# Patient Record
Sex: Female | Born: 1945 | Race: White | Hispanic: No | Marital: Married | Smoking: Never smoker
Health system: Southern US, Community
[De-identification: ages and names within clinical notes are randomized; demographics above are authoritative.]

## PROBLEM LIST (undated history)

## (undated) DIAGNOSIS — I10 Essential (primary) hypertension: Secondary | ICD-10-CM

## (undated) DIAGNOSIS — F419 Anxiety disorder, unspecified: Secondary | ICD-10-CM

## (undated) DIAGNOSIS — E7800 Pure hypercholesterolemia, unspecified: Secondary | ICD-10-CM

## (undated) DIAGNOSIS — R51 Headache: Secondary | ICD-10-CM

## (undated) DIAGNOSIS — R519 Headache, unspecified: Secondary | ICD-10-CM

## (undated) DIAGNOSIS — R1310 Dysphagia, unspecified: Secondary | ICD-10-CM

## (undated) DIAGNOSIS — K219 Gastro-esophageal reflux disease without esophagitis: Secondary | ICD-10-CM

## (undated) DIAGNOSIS — F32A Depression, unspecified: Secondary | ICD-10-CM

## (undated) DIAGNOSIS — F329 Major depressive disorder, single episode, unspecified: Secondary | ICD-10-CM

## (undated) HISTORY — PX: CHOLECYSTECTOMY: SHX55

## (undated) HISTORY — PX: BACK SURGERY: SHX140

## (undated) HISTORY — DX: Dysphagia, unspecified: R13.10

## (undated) HISTORY — PX: ABDOMINAL HYSTERECTOMY: SHX81

## (undated) HISTORY — DX: Essential (primary) hypertension: I10

---

## 2011-12-31 HISTORY — PX: NASAL SEPTUM SURGERY: SHX37

## 2011-12-31 HISTORY — PX: BLADDER SUSPENSION: SHX72

## 2014-04-06 ENCOUNTER — Other Ambulatory Visit (INDEPENDENT_AMBULATORY_CARE_PROVIDER_SITE_OTHER): Payer: Self-pay | Admitting: *Deleted

## 2014-04-06 DIAGNOSIS — K838 Other specified diseases of biliary tract: Secondary | ICD-10-CM

## 2014-04-06 DIAGNOSIS — R509 Fever, unspecified: Secondary | ICD-10-CM

## 2014-04-06 DIAGNOSIS — R109 Unspecified abdominal pain: Secondary | ICD-10-CM

## 2014-04-06 DIAGNOSIS — K8309 Other cholangitis: Secondary | ICD-10-CM

## 2014-04-07 ENCOUNTER — Encounter (HOSPITAL_COMMUNITY): Payer: Self-pay | Admitting: Pharmacy Technician

## 2014-04-07 ENCOUNTER — Other Ambulatory Visit: Payer: Self-pay

## 2014-04-07 ENCOUNTER — Encounter (HOSPITAL_COMMUNITY)
Admission: RE | Admit: 2014-04-07 | Discharge: 2014-04-07 | Disposition: A | Discharge status: Home / Self Care | Payer: Medicare Other | Source: Ambulatory Visit | Attending: Internal Medicine | Admitting: Internal Medicine

## 2014-04-07 ENCOUNTER — Encounter (HOSPITAL_COMMUNITY): Payer: Self-pay

## 2014-04-07 VITALS — BP 133/59 | HR 63 | Temp 97.1°F | Resp 16 | Ht 61.0 in | Wt 133.0 lb

## 2014-04-07 DIAGNOSIS — K8309 Other cholangitis: Secondary | ICD-10-CM

## 2014-04-07 DIAGNOSIS — R109 Unspecified abdominal pain: Secondary | ICD-10-CM

## 2014-04-07 DIAGNOSIS — R509 Fever, unspecified: Secondary | ICD-10-CM

## 2014-04-07 DIAGNOSIS — K838 Other specified diseases of biliary tract: Secondary | ICD-10-CM

## 2014-04-07 HISTORY — DX: Major depressive disorder, single episode, unspecified: F32.9

## 2014-04-07 HISTORY — DX: Pure hypercholesterolemia, unspecified: E78.00

## 2014-04-07 HISTORY — DX: Depression, unspecified: F32.A

## 2014-04-07 LAB — COMPREHENSIVE METABOLIC PANEL
ALT: 11 U/L (ref 0–35)
AST: 15 U/L (ref 0–37)
Albumin: 4.1 g/dL (ref 3.5–5.2)
Alkaline Phosphatase: 93 U/L (ref 39–117)
BUN: 24 mg/dL — ABNORMAL HIGH (ref 6–23)
CO2: 29 mEq/L (ref 19–32)
Calcium: 9.4 mg/dL (ref 8.4–10.5)
Chloride: 103 mEq/L (ref 96–112)
Creatinine, Ser: 0.88 mg/dL (ref 0.50–1.10)
GFR calc Af Amer: 76 mL/min — ABNORMAL LOW (ref >90)
GFR calc non Af Amer: 66 mL/min — ABNORMAL LOW (ref >90)
Glucose, Bld: 82 mg/dL (ref 70–99)
Potassium: 5 mEq/L (ref 3.7–5.3)
Sodium: 141 mEq/L (ref 137–147)
Total Bilirubin: 0.3 mg/dL (ref 0.3–1.2)
Total Protein: 6.5 g/dL (ref 6.0–8.3)

## 2014-04-07 LAB — PROTIME-INR
INR: 0.92 (ref 0.00–1.49)
Prothrombin Time: 12.2 seconds (ref 11.6–15.2)

## 2014-04-07 LAB — HEMOGLOBIN AND HEMATOCRIT, BLOOD
HCT: 37.4 % (ref 36.0–46.0)
Hemoglobin: 12.4 g/dL (ref 12.0–15.0)

## 2014-04-07 NOTE — Patient Instructions (Signed)
Quetzaly Ebner  04/07/2014   Your procedure is scheduled on:  Friday, 04/08/14  Report to [facility] at [facility].  Call this number if you have problems the morning of surgery: [phone]   Remember:   Do not eat food or drink liquids after midnight.   Take these medicines the morning of surgery with A SIP OF WATER: xanax and protonix   Do not wear jewelry, make-up or nail polish.  Do not wear lotions, powders, or perfumes. You may wear deodorant.  Do not shave 48 hours prior to surgery. Men may shave face and neck.  Do not bring valuables to the hospital.  [facility] is not responsible                  for any belongings or valuables.               Contacts, dentures or bridgework may not be worn into surgery.  Leave suitcase in the car. After surgery it may be brought to your room.  For patients admitted to the hospital, discharge time is determined by your                treatment team.               Patients discharged the day of surgery will not be allowed to drive  home.  Name and phone number of your driver: family  Special Instructions: Shower using CHG 2 nights before surgery and the night before surgery.  If you shower the day of surgery use CHG.  Use special wash - you have one bottle of CHG for all showers.  You should use approximately 1/3 of the bottle for each shower.   Please read over the following fact sheets that you were given: Pain Booklet, MRSA Information, Surgical Site Infection Prevention, Anesthesia Post-op Instructions and Care and Recovery After Surgery   Endoscopic Retrograde Cholangiopancreatography (ERCP), Care After Refer to this sheet in the next few weeks. These instructions provide you with information on caring for yourself after your procedure. Your health care provider may also give you more specific instructions. Your treatment has been planned according to current medical practices, but problems sometimes occur. Call your health care provider if you  have any problems or questions after your procedure.  WHAT TO EXPECT AFTER THE PROCEDURE  After your procedure, it is typical to feel:   Soreness in your throat.   Sick to your stomach (nauseous).   Bloated.  Dizzy.   Fatigued. HOME CARE INSTRUCTIONS  Have a friend or family member stay with you for the first 24 hours after your procedure.  Start taking your usual medicines and eating normally as soon as you feel well enough to do so or as directed by your health care provider. SEEK MEDICAL CARE IF:  You have abdominal pain.   You develop signs of infection, such as:   Chills.   Feeling unwell.  SEEK IMMEDIATE MEDICAL CARE IF:  You have difficulty swallowing.  You have worsening throat, chest, or abdominal pain.  You vomit.  You have bloody or very black stools.  You have a fever. Document Released: 10/06/2013 Document Reviewed: 06/21/2013 [facility] Patient Information 2014 [place], [place]. Endoscopic Retrograde Cholangiopancreatography (ERCP) Endoscopic retrograde cholangiopancreatography (ERCP) is a procedure used to diagnosis many diseases of the pancreas, bile ducts, liver, and gallbladder. During ERCP a thin, lighted tube (endoscope) is passed through the mouth and down the back of the throat into the  first part of the small intestine (duodenum). A small, plastic tube (cannula) is then passed through the endoscope and directed into the bile duct or pancreatic duct. Dye is then injected through the cannula and X-rays are taken to study the biliary and pancreatic passageways.  LET [facility] PROVIDER KNOW ABOUT:   Any allergies you have.   All medicines you are taking, including vitamins, herbs, eyedrops, creams, and over-the-counter medicines.   Previous problems you or members of your family have had with the use of anesthetics.   Any blood disorders you have.   Previous surgeries you have had.   Medical conditions you have. RISKS AND  COMPLICATIONS Generally, ERCP is a safe procedure. However, as with any procedure, complications can occur. A simple removal of gallstones has the lowest rate of complications. Higher rates of complication occur in people who have poorly functioning bile or pancreatic ducts. Possible complications include:   Pancreatitis.  Bleeding.  Accidental punctures in the bowel wall, pancreas, or gall bladder.  Gall bladder or bile duct infection. BEFORE THE PROCEDURE   Do not eat or drink anything, including water, for at least 8 hours before the procedure or as directed by your health care provider.   Ask your health care provider whether you should stop taking certain medicines prior to your procedure.   Arrange for someone to drive you home. You will not be allowed to drive for 12 24 hours after the procedure. PROCEDURE   You will be given medicine through a vein (intravenously) to make you relaxed and sleepy.   You might have a breathing tube placed to give you medicine that makes you sleep (general anesthetic).   Your throat may be sprayed with medicine that numbs the area and prevents gagging (local anesthetic), or you may gargle this medicine.   You will lie on your left side.   The endoscope will be inserted through your mouth and into the duodenum. The tube will not interfere with your breathing. Gagging is prevented by the anesthesia.   While X-rays are being taken, you may be positioned on your stomach.   A small sample of tissue (biopsy) may be removed for examination. AFTER THE PROCEDURE   You will rest in bed until you are fully conscious.   When you first wake up, your throat may feel slightly sore.   You will not be allowed to eat or drink until numbness subsides.   Once you are able to drink, urinate, and sit on the edge of the bed without feeling sick to your stomach (nauseous) or dizzy, you may be allowed to go home. Document Released: 09/10/2001 Document  Revised: 10/06/2013 Document Reviewed: 07/27/2013 [facility] Patient Information 2014 [place].

## 2014-04-08 ENCOUNTER — Encounter (HOSPITAL_COMMUNITY): Payer: Medicare Other | Admitting: Anesthesiology

## 2014-04-08 ENCOUNTER — Ambulatory Visit (HOSPITAL_COMMUNITY): Payer: Medicare Other

## 2014-04-08 ENCOUNTER — Ambulatory Visit (HOSPITAL_COMMUNITY): Payer: Medicare Other | Admitting: Anesthesiology

## 2014-04-08 ENCOUNTER — Ambulatory Visit (HOSPITAL_COMMUNITY)
Admission: RE | Admit: 2014-04-08 | Discharge: 2014-04-08 | Disposition: A | Discharge status: Home / Self Care | Payer: Medicare Other | Source: Ambulatory Visit | Attending: Internal Medicine | Admitting: Internal Medicine

## 2014-04-08 ENCOUNTER — Encounter (HOSPITAL_COMMUNITY)
Admission: RE | Disposition: A | Discharge status: Home / Self Care | Payer: Self-pay | Source: Ambulatory Visit | Attending: Internal Medicine

## 2014-04-08 ENCOUNTER — Encounter (HOSPITAL_COMMUNITY): Payer: Self-pay | Admitting: *Deleted

## 2014-04-08 DIAGNOSIS — R109 Unspecified abdominal pain: Secondary | ICD-10-CM

## 2014-04-08 DIAGNOSIS — K8020 Calculus of gallbladder without cholecystitis without obstruction: Secondary | ICD-10-CM | POA: Insufficient documentation

## 2014-04-08 DIAGNOSIS — R509 Fever, unspecified: Secondary | ICD-10-CM

## 2014-04-08 DIAGNOSIS — Z79899 Other long term (current) drug therapy: Secondary | ICD-10-CM | POA: Insufficient documentation

## 2014-04-08 DIAGNOSIS — F3289 Other specified depressive episodes: Secondary | ICD-10-CM | POA: Insufficient documentation

## 2014-04-08 DIAGNOSIS — K8309 Other cholangitis: Secondary | ICD-10-CM

## 2014-04-08 DIAGNOSIS — E7800 Pure hypercholesterolemia, unspecified: Secondary | ICD-10-CM

## 2014-04-08 DIAGNOSIS — K219 Gastro-esophageal reflux disease without esophagitis: Secondary | ICD-10-CM | POA: Insufficient documentation

## 2014-04-08 DIAGNOSIS — F329 Major depressive disorder, single episode, unspecified: Secondary | ICD-10-CM | POA: Insufficient documentation

## 2014-04-08 DIAGNOSIS — K838 Other specified diseases of biliary tract: Secondary | ICD-10-CM | POA: Insufficient documentation

## 2014-04-08 HISTORY — PX: BALLOON DILATION: SHX5330

## 2014-04-08 HISTORY — PX: ERCP: SHX5425

## 2014-04-08 HISTORY — PX: SPHINCTEROTOMY: SHX5544

## 2014-04-08 HISTORY — PX: PANCREATIC STENT PLACEMENT: SHX5539

## 2014-04-08 SURGERY — ERCP, WITH INTERVENTION IF INDICATED
Anesthesia: General

## 2014-04-08 MED ORDER — GLYCOPYRROLATE 0.2 MG/ML IJ SOLN
INTRAMUSCULAR | Status: AC
  Filled 2014-04-08: qty 2

## 2014-04-08 MED ORDER — INDOMETHACIN 50 MG RE SUPP
RECTAL | Status: AC
  Filled 2014-04-08: qty 1

## 2014-04-08 MED ORDER — LACTATED RINGERS IV SOLN
INTRAVENOUS | Status: DC
  Administered 2014-04-08: 07:00:00 via INTRAVENOUS

## 2014-04-08 MED ORDER — CEFAZOLIN SODIUM-DEXTROSE 2-3 GM-% IV SOLR
2.0000 g | INTRAVENOUS | Status: AC
  Administered 2014-04-08: 2 g via INTRAVENOUS
  Filled 2014-04-08: qty 50

## 2014-04-08 MED ORDER — FENTANYL CITRATE 0.05 MG/ML IJ SOLN
INTRAMUSCULAR | Status: AC
  Filled 2014-04-08: qty 2

## 2014-04-08 MED ORDER — GLUCAGON HCL (RDNA) 1 MG IJ SOLR
INTRAMUSCULAR | Status: AC
  Filled 2014-04-08: qty 1

## 2014-04-08 MED ORDER — PROPOFOL 10 MG/ML IV EMUL
INTRAVENOUS | Status: AC
  Filled 2014-04-08: qty 20

## 2014-04-08 MED ORDER — GLYCOPYRROLATE 0.2 MG/ML IJ SOLN
0.2000 mg | Freq: Once | INTRAMUSCULAR | Status: AC
  Administered 2014-04-08: 0.2 mg via INTRAVENOUS

## 2014-04-08 MED ORDER — PROPOFOL 10 MG/ML IV BOLUS
INTRAVENOUS | Status: DC | PRN
  Administered 2014-04-08: 130 mg via INTRAVENOUS
  Administered 2014-04-08: 20 mg via INTRAVENOUS

## 2014-04-08 MED ORDER — ONDANSETRON HCL 4 MG/2ML IJ SOLN
4.0000 mg | Freq: Once | INTRAMUSCULAR | Status: AC | PRN
  Administered 2014-04-08: 4 mg via INTRAVENOUS
  Filled 2014-04-08: qty 2

## 2014-04-08 MED ORDER — PROPOFOL 10 MG/ML IV EMUL
INTRAVENOUS | Status: AC
Start: 2014-04-08 — End: 2014-04-08
  Filled 2014-04-08: qty 20

## 2014-04-08 MED ORDER — MIDAZOLAM HCL 2 MG/2ML IJ SOLN
INTRAMUSCULAR | Status: AC
Start: 2014-04-08 — End: 2014-04-08
  Filled 2014-04-08: qty 2

## 2014-04-08 MED ORDER — INDOMETHACIN 50 MG RE SUPP
50.0000 mg | Freq: Once | RECTAL | Status: AC
  Administered 2014-04-08: 50 mg via RECTAL

## 2014-04-08 MED ORDER — NEOSTIGMINE METHYLSULFATE 1 MG/ML IJ SOLN
INTRAMUSCULAR | Status: AC
  Filled 2014-04-08: qty 1

## 2014-04-08 MED ORDER — IOHEXOL 350 MG/ML SOLN
INTRAVENOUS | Status: DC | PRN
  Administered 2014-04-08: 08:00:00

## 2014-04-08 MED ORDER — LIDOCAINE HCL (CARDIAC) 10 MG/ML IV SOLN
INTRAVENOUS | Status: DC | PRN
  Administered 2014-04-08: 10 mg via INTRAVENOUS

## 2014-04-08 MED ORDER — STERILE WATER FOR IRRIGATION IR SOLN
Status: DC | PRN
  Administered 2014-04-08: 08:00:00

## 2014-04-08 MED ORDER — GLYCOPYRROLATE 0.2 MG/ML IJ SOLN
INTRAMUSCULAR | Status: DC | PRN
  Administered 2014-04-08: 0.2 mg via INTRAVENOUS

## 2014-04-08 MED ORDER — ONDANSETRON HCL 4 MG/2ML IJ SOLN
4.0000 mg | Freq: Once | INTRAMUSCULAR | Status: AC
  Administered 2014-04-08: 4 mg via INTRAVENOUS

## 2014-04-08 MED ORDER — DEXAMETHASONE SODIUM PHOSPHATE 4 MG/ML IJ SOLN
INTRAMUSCULAR | Status: AC
  Filled 2014-04-08: qty 1

## 2014-04-08 MED ORDER — LACTATED RINGERS IV SOLN: INTRAVENOUS | Status: DC

## 2014-04-08 MED ORDER — ONDANSETRON HCL 4 MG/2ML IJ SOLN
INTRAMUSCULAR | Status: AC
  Filled 2014-04-08: qty 2

## 2014-04-08 MED ORDER — LIDOCAINE HCL (PF) 1 % IJ SOLN
INTRAMUSCULAR | Status: AC
Start: 2014-04-08 — End: 2014-04-08
  Filled 2014-04-08: qty 5

## 2014-04-08 MED ORDER — FENTANYL CITRATE 0.05 MG/ML IJ SOLN
INTRAMUSCULAR | Status: DC | PRN
  Administered 2014-04-08: 25 ug via INTRAVENOUS
  Administered 2014-04-08: 50 ug via INTRAVENOUS
  Administered 2014-04-08: 25 ug via INTRAVENOUS

## 2014-04-08 MED ORDER — MIDAZOLAM HCL 2 MG/2ML IJ SOLN
1.0000 mg | INTRAMUSCULAR | Status: DC | PRN
  Administered 2014-04-08: 2 mg via INTRAVENOUS

## 2014-04-08 MED ORDER — FENTANYL CITRATE 0.05 MG/ML IJ SOLN
25.0000 ug | INTRAMUSCULAR | Status: DC | PRN
  Administered 2014-04-08: 25 ug via INTRAVENOUS
  Administered 2014-04-08: 50 ug via INTRAVENOUS
  Administered 2014-04-08: 25 ug via INTRAVENOUS

## 2014-04-08 MED ORDER — GLUCAGON HCL (RDNA) 1 MG IJ SOLR
INTRAMUSCULAR | Status: DC | PRN
  Administered 2014-04-08 (×8): 0.25 mg via INTRAVENOUS

## 2014-04-08 MED ORDER — HYDROCODONE-ACETAMINOPHEN 5-300 MG PO TABS: 1.0000 | ORAL_TABLET | Freq: Four times a day (QID) | ORAL | Status: DC | PRN

## 2014-04-08 MED ORDER — NEOSTIGMINE METHYLSULFATE 1 MG/ML IJ SOLN
INTRAMUSCULAR | Status: DC | PRN
  Administered 2014-04-08: 1 mg via INTRAVENOUS

## 2014-04-08 MED ORDER — ROCURONIUM BROMIDE 100 MG/10ML IV SOLN
INTRAVENOUS | Status: DC | PRN
  Administered 2014-04-08: 5 mg via INTRAVENOUS
  Administered 2014-04-08: 25 mg via INTRAVENOUS

## 2014-04-08 MED ORDER — GLYCOPYRROLATE 0.2 MG/ML IJ SOLN
INTRAMUSCULAR | Status: AC
  Filled 2014-04-08: qty 1

## 2014-04-08 SURGICAL SUPPLY — 29 items
BAG HAMPER (MISCELLANEOUS) ×3 IMPLANT
BALLN RETRIEVAL 12X15 (BALLOONS) ×2 IMPLANT
BALLN RETRIEVAL 12X15MM (BALLOONS) ×1
BASKET TRAPEZOID 3X6 (MISCELLANEOUS) IMPLANT
DEVICE INFLATION ENCORE 26 (MISCELLANEOUS) IMPLANT
DEVICE LOCKING W-BIOPSY CAP (MISCELLANEOUS) ×3 IMPLANT
GUIDEWIRE HYDRA JAGWIRE .35 (WIRE) IMPLANT
GUIDEWIRE JAG HINI 025X260CM (WIRE) IMPLANT
KIT CLEAN ENDO COMPLIANCE (KITS) ×3 IMPLANT
KIT ROOM TURNOVER APOR (KITS) ×3 IMPLANT
LUBRICANT JELLY 4.5OZ STERILE (MISCELLANEOUS) ×3 IMPLANT
NEEDLE HYPO 18GX1.5 BLUNT FILL (NEEDLE) ×3 IMPLANT
PAD ARMBOARD 7.5X6 YLW CONV (MISCELLANEOUS) ×6 IMPLANT
PATHFINDER 450CM 0.18 (STENTS) IMPLANT
POSITIONER HEAD 8X9X4 ADT (SOFTGOODS) IMPLANT
SNARE ROTATE MED OVAL 20MM (MISCELLANEOUS) IMPLANT
SNARE SHORT THROW 13M SML OVAL (MISCELLANEOUS) ×3 IMPLANT
SPHINCTEROTOME AUTOTOME .25 (MISCELLANEOUS) IMPLANT
SPHINCTEROTOME HYDRATOME 44 (MISCELLANEOUS) ×9 IMPLANT
SPONGE GAUZE 4X4 12PLY (GAUZE/BANDAGES/DRESSINGS) ×3 IMPLANT
STENT PANCREATIC PIGTAIL 4FX5 (Stent) ×3 IMPLANT
STENT RX PRELOADED 10FRX7CM (Stent) ×3 IMPLANT
SYR 3ML LL SCALE MARK (SYRINGE) ×3 IMPLANT
SYR 50ML LL SCALE MARK (SYRINGE) ×6 IMPLANT
SYSTEM CONTINUOUS INJECTION (MISCELLANEOUS) ×3 IMPLANT
TUBING ENDO SMARTCAP PENTAX (MISCELLANEOUS) ×3 IMPLANT
WALLSTENT METAL COVERED 10X60 (STENTS) IMPLANT
WALLSTENT METAL COVERED 10X80 (STENTS) IMPLANT
WATER STERILE IRR 1000ML POUR (IV SOLUTION) ×6 IMPLANT

## 2014-04-08 NOTE — Anesthesia Procedure Notes (Addendum)
Procedure Name: Intubation Date/Time: 04/08/2014 7:46 AM Performed by: Franco NonesYATES, Braelynn Benning S Pre-anesthesia Checklist: Patient identified, Patient being monitored, Timeout performed, Emergency Drugs available and Suction available Patient Re-evaluated:Patient Re-evaluated prior to inductionOxygen Delivery Method: Circle System Utilized Preoxygenation: Pre-oxygenation with 100% oxygen Intubation Type: IV induction, Rapid sequence and Cricoid Pressure applied Ventilation: Mask ventilation without difficulty and Oral airway inserted - appropriate to patient size Laryngoscope Size: Hyacinth MeekerMiller and 2 Grade View: Grade I Tube type: Oral Tube size: 7.0 mm Number of attempts: 1 Airway Equipment and Method: stylet Placement Confirmation: ETT inserted through vocal cords under direct vision,  positive ETCO2 and breath sounds checked- equal and bilateral Secured at: 20 cm Tube secured with: Tape Dental Injury: Teeth and Oropharynx as per pre-operative assessment

## 2014-04-08 NOTE — Transfer of Care (Signed)
Immediate Anesthesia Transfer of Care Note  Patient: Julie [name]  Procedure(s) Performed: Procedure(s) (LRB): ENDOSCOPIC RETROGRADE CHOLANGIOPANCREATOGRAPHY (ERCP) (N/A) BILIARY SPHINCTEROTOMY (N/A) BALLOON DILATION (N/A) PANCREATIC STENT PLACEMENT (N/A)  Patient Location: PACU  Anesthesia Type: General  Level of Consciousness: awake  Airway & Oxygen Therapy: Patient Spontanous Breathing and non-rebreather face mask  Post-op Assessment: Report given to PACU RN, Post -op Vital signs reviewed and stable and Patient moving all extremities  Post vital signs: Reviewed and stable  Complications: No apparent anesthesia complications

## 2014-04-08 NOTE — Anesthesia Preprocedure Evaluation (Addendum)
Anesthesia Evaluation  Patient identified by MRN, date of birth, ID band Patient awake    Reviewed: Allergy & Precautions, H&P , NPO status , Patient's Chart, lab work & pertinent test results  History of Anesthesia Complications Negative for: history of anesthetic complications  Airway Mallampati: III TM Distance: >3 FB     Dental  (+) Teeth Intact   Pulmonary neg pulmonary ROS,  breath sounds clear to auscultation        Cardiovascular negative cardio ROS  Rhythm:Regular Rate:Normal     Neuro/Psych PSYCHIATRIC DISORDERS Depression    GI/Hepatic GERD-  Medicated and Controlled,  Endo/Other    Renal/GU      Musculoskeletal   Abdominal   Peds  Hematology   Anesthesia Other Findings   Reproductive/Obstetrics                          Anesthesia Physical Anesthesia Plan  ASA: II  Anesthesia Plan: General   Post-op Pain Management:    Induction: Intravenous, Cricoid pressure planned and Rapid sequence  Airway Management Planned: Oral ETT  Additional Equipment:   Intra-op Plan:   Post-operative Plan: Extubation in OR  Informed Consent: I have reviewed the patients History and Physical, chart, labs and discussed the procedure including the risks, benefits and alternatives for the proposed anesthesia with the patient or authorized representative who has indicated his/her understanding and acceptance.     Plan Discussed with:   Anesthesia Plan Comments:         Anesthesia Quick Evaluation

## 2014-04-08 NOTE — Discharge Instructions (Signed)
Resume usual medications but do not take Celebrex or aspirin for 3 days. Clear liquids today and then usual diet starting in a.m. No driving for 24 hours. Notify if you have 10 greater than 100 F or pain not relieved with hydrocodone/acetaminophen.    Gastrointestinal Endoscopy Care After Refer to this sheet in the next few weeks. These instructions provide you with information on caring for yourself after your procedure. Your caregiver may also give you more specific instructions. Your treatment has been planned according to current medical practices, but problems sometimes occur. Call your caregiver if you have any problems or questions after your procedure. HOME CARE INSTRUCTIONS  If you were given medicine to help you relax (sedative), do not drive, operate machinery, or sign important documents for 24 hours.  Avoid alcohol and hot or warm beverages for the first 24 hours after the procedure.  Only take over-the-counter or prescription medicines for pain, discomfort, or fever as directed by your caregiver. You may resume taking your normal medicines unless your caregiver tells you otherwise. Ask your caregiver when you may resume taking medicines that may cause bleeding, such as aspirin, clopidogrel, or warfarin.  You may return to your normal diet and activities on the day after your procedure, or as directed by your caregiver. Walking may help to reduce any bloated feeling in your abdomen.  Drink enough fluids to keep your urine clear or pale yellow.  You may gargle with salt water if you have a sore throat. SEEK IMMEDIATE MEDICAL CARE IF:  You have severe nausea or vomiting.  You have severe abdominal pain, abdominal cramps that last longer than 6 hours, or abdominal swelling (distention).  You have severe shoulder or back pain.  You have trouble swallowing.  You have shortness of breath, your breathing is shallow, or you are breathing faster than normal.  You have a fever  or a rapid heartbeat.  You vomit blood or material that looks like coffee grounds.  You have bloody, black, or tarry stools. MAKE SURE YOU:  Understand these instructions.  Will watch your condition.  Will get help right away if you are not doing well or get worse. Document Released: 07/30/2004 Document Revised: 06/16/2012 Document Reviewed: 03/17/2012 [facility] Patient Information 2014 [place], [place].

## 2014-04-08 NOTE — Anesthesia Postprocedure Evaluation (Signed)
Anesthesia Post Note  Patient: Julie [name]  Procedure(s) Performed: Procedure(s) (LRB): ENDOSCOPIC RETROGRADE CHOLANGIOPANCREATOGRAPHY (ERCP) (N/A) BILIARY SPHINCTEROTOMY (N/A) BALLOON DILATION (N/A) PANCREATIC STENT PLACEMENT (N/A)  Anesthesia type: General  Patient location: PACU  Post pain: Pain level controlled  Post assessment: Post-op Vital signs reviewed, Patient's Cardiovascular Status Stable, Respiratory Function Stable, Patent Airway, No signs of Nausea or vomiting and Pain level controlled  Last Vitals:  Filed Vitals:   04/08/14 1031  BP: 114/54  Pulse: 90  Temp: 36.5 C  Resp: 16    Post vital signs: Reviewed and stable  Level of consciousness: awake    Complications: No apparent anesthesia complications

## 2014-04-08 NOTE — Op Note (Addendum)
ERCP PROCEDURE REPORT  PATIENT:  Julie [name]  MR#:  [id number] Birthdate:  1946-06-28, 68 y.o., female Endoscopist:  Dr. Malissa HippoNajeeb U. [name], MD Referred By:  Dr. Bonnetta BarryNo ref. provider found Procedure Date: 04/08/2014  Procedure:   ERCP with biliary sphincterotomy and pancreatic stenting.    Indications:  Patient is 68 year old Caucasian female who presents with history of 4 episodes of fever and chills and on workup found to have cholelithiasis and dilated CBD. She is undergoing ERCP with therapeutic intervention prior to cholecystectomy.            Informed Consent:  The risks, benefits, limitations, alternatives, and mponderable have been reviewed with the patient. I specifically discussed a 1 in 10 chance of pancreatitis, reaction to medications, bleeding, perforation and the possibility of a failed ERCP. Potential for sphincterotomy and stent placement also reviewed. Questions have been answered. All parties agreeable.  Please see history & physical in medical record for more information.  Medications:  Gen. endotracheal anesthesia. *Please see anesthesia record for complete details  Description of procedure:  Procedure performed in the OR. The patient was placed under anesthesia, intubated, and turned into semipermanent position. Therapeutic Pentax video duodenoscope passed through the oropharynx without any difficulty into the esophagus, stomach, and across the pylorus and pull, and descending duodenum.    Findings:  Normal appearing ampulla of Vater with short intramural segment. Normal pancreatogram. Difficult cannulation of CBD. Therefore guidewire guidewire left in pancreatic duct to help with cannulation and to leave pancreatic stent for prophylaxis. Markedly dilated CBD and CHD with poor filling of distal segment. CHD measured about 15 mm in diameter. Multiple filling defects in gallbladder co with stones. Biliary sphincterotomy performed. Possible accessory right hepatic duct were  large cystic duct. Biliary stent not placed because hydrajag wire kept going into accessory duct or cystic duct.  Therapeutic/Diagnostic Maneuvers Performed:    4 French 5 cm long single pigtail pancreatic stent placed for prophylaxis.   Complications:  None  Impression:  Dilated CBD and CHD without obvious stones. Long cystic duct remnant versus accessory right hepatic duct. Cholelithiasis. Biliary sphincterotomy performed. Pancreatic stent placed for prophylaxis. Biliary stent not placed.  Recommendations:  Indomethacin suppository 50 mg per rectum in PACU. No aspirin or NSAIDs for 72 hours. Clear liquids today. Hydrocodone/acetaminophen 5//300 by mouth every 6 when necessary for pain. Surgical consultation at a later date for cholecystectomy. Patient will need KUB in two weeks unless stent passes spontaneously.  Malissa Hippoajeeb U [name] List  04/08/2014  10:29 AM  CC: Dr. Oneita HurtPcp Not In System & Dr. No ref. provider found   Addendum; Plain abdominal film reveals no residual contrast in bile duct.

## 2014-04-08 NOTE — H&P (Signed)
Julie [name] is an 68 y.o. female.   Chief Complaint: Patient is here for ERCP with sphincterotomy. HPI: Patient is 68 year old Caucasian female who is referred through courtesy of  Dr. West Carbo for therapeutic ERCP. Patient was evaluated by him last month following an episode of fever and chills and some shoulder pain. He was also complaining of dysphagia and has undergone esophageal dilation with resolution of her dysphagia. Further evaluation revealed cholelithiasis and dilated bile duct leading to an MRCP. Once again bile duct was noted to be dilated but no stones are identified. It tapered distally. He therefore recommended preop ERCP prior to cholecystectomy. Patient states she has had 4 episodes of fever and rigors in the last 8 months. She has had nausea and vomiting with these spells and no abdominal pain. She has had intermittent shoulder pain. She has lost a few pounds which she states is voluntary. She denies melena or rectal bleeding. Family history is significant for gallstones and a granddaughter who had surgery at each 75. Patient states she's undergone about 6 EGD for dilations for dysphagia. She had screening colonoscopy last year.   Past Medical History  Diagnosis Date  . Hypercholesteremia   . Depression     Past Surgical History  Procedure Laterality Date  . Abdominal hysterectomy    . Bladder suspension  2013    [facility]  . Nasal septum surgery  2013    [facility]    History reviewed. No pertinent family history. Social History:  reports that she has never smoked. She does not have any smokeless tobacco history on file. She reports that she does not drink alcohol or use illicit drugs.  Allergies: No Known Allergies  Medications Prior to Admission  Medication Sig Dispense Refill  . ALPRAZolam (XANAX) 0.5 MG tablet Take 0.5 mg by mouth daily.      Marland Kitchen atorvastatin (LIPITOR) 20 MG tablet Take 20 mg by mouth daily.      . celecoxib (CELEBREX) 200  MG capsule Take 200 mg by mouth daily.      Marland Kitchen estradiol (ESTRACE) 0.5 MG tablet Take 0.5 mg by mouth every other day.      . pantoprazole (PROTONIX) 40 MG tablet Take 40 mg by mouth daily.      . traZODone (DESYREL) 100 MG tablet Take 100 mg by mouth 2 (two) times daily.      . Vitamin D, Ergocalciferol, (DRISDOL) 50000 UNITS CAPS capsule Take 50,000 Units by mouth every 7 (seven) days.        Results for orders placed during the hospital encounter of 04/07/14 (from the past 48 hour(s))  COMPREHENSIVE METABOLIC PANEL     Status: Abnormal   Collection Time    04/07/14  3:15 PM      Result Value Ref Range   Sodium 141  137 - 147 mEq/L   Potassium 5.0  3.7 - 5.3 mEq/L   Chloride 103  96 - 112 mEq/L   CO2 29  19 - 32 mEq/L   Glucose, Bld 82  70 - 99 mg/dL   BUN 24 (*) 6 - 23 mg/dL   Creatinine, Ser 0.88  0.50 - 1.10 mg/dL   Calcium 9.4  8.4 - 10.5 mg/dL   Total Protein 6.5  6.0 - 8.3 g/dL   Albumin 4.1  3.5 - 5.2 g/dL   AST 15  0 - 37 U/L   ALT 11  0 - 35 U/L   Alkaline Phosphatase 93  39 -  117 U/L   Total Bilirubin 0.3  0.3 - 1.2 mg/dL   GFR calc non Af Amer 66 (*) >90 mL/min   GFR calc Af Amer 76 (*) >90 mL/min   Comment: (NOTE)     The eGFR has been calculated using the CKD EPI equation.     This calculation has not been validated in all clinical situations.     eGFR's persistently <90 mL/min signify possible Chronic Kidney     Disease.  PROTIME-INR     Status: None   Collection Time    04/07/14  3:15 PM      Result Value Ref Range   Prothrombin Time 12.2  11.6 - 15.2 seconds   INR 0.92  0.00 - 1.49  HEMOGLOBIN AND HEMATOCRIT, BLOOD     Status: None   Collection Time    04/07/14  3:15 PM      Result Value Ref Range   Hemoglobin 12.4  12.0 - 15.0 g/dL   HCT 37.4  36.0 - 46.0 %   No results found.  ROS  There were no vitals taken for this visit. Physical Exam  Constitutional: She appears well-developed and well-nourished.  HENT:  Mouth/Throat: Oropharynx is clear  and moist.  Eyes: Conjunctivae are normal. No scleral icterus.  Neck: No thyromegaly present.  Cardiovascular: Normal rate, regular rhythm and normal heart sounds.   No murmur heard. Respiratory: Effort normal and breath sounds normal.  GI: Soft. Tenderness: mild midepigastric tendreness.  Musculoskeletal: She exhibits no edema.  Lymphadenopathy:    She has no cervical adenopathy.  Neurological: She is alert.  Skin: Skin is warm and dry.     Assessment/Plan Patient is 68 year old Caucasian female with history of fever and rigors found to have cholelithiasis and dilated bile duct. History is suggestive of self-limiting bouts of cholangitis and she is possibly passing stones. Patient is undergoing ERCP with sphincterotomy prior to cholecystectomy. Procedure and risks reviewed with the patient and her husband and they're agreeable  Tadashi Burkel U Julie [name] 04/08/2014, 7:21 AM

## 2014-04-11 ENCOUNTER — Encounter (HOSPITAL_COMMUNITY): Payer: Self-pay | Admitting: Internal Medicine

## 2014-04-21 ENCOUNTER — Other Ambulatory Visit (INDEPENDENT_AMBULATORY_CARE_PROVIDER_SITE_OTHER): Payer: Self-pay | Admitting: Internal Medicine

## 2014-04-21 DIAGNOSIS — Z9889 Other specified postprocedural states: Secondary | ICD-10-CM

## 2014-04-22 ENCOUNTER — Ambulatory Visit (HOSPITAL_COMMUNITY)
Admission: RE | Admit: 2014-04-22 | Discharge: 2014-04-22 | Disposition: A | Discharge status: Home / Self Care | Payer: Medicare Other | Source: Ambulatory Visit | Attending: Internal Medicine | Admitting: Internal Medicine

## 2014-04-22 DIAGNOSIS — Z9889 Other specified postprocedural states: Secondary | ICD-10-CM

## 2014-04-22 DIAGNOSIS — Z09 Encounter for follow-up examination after completed treatment for conditions other than malignant neoplasm: Secondary | ICD-10-CM | POA: Insufficient documentation

## 2014-12-20 ENCOUNTER — Encounter (INDEPENDENT_AMBULATORY_CARE_PROVIDER_SITE_OTHER): Payer: Self-pay

## 2015-01-21 IMAGING — CR DG ABDOMEN 1V
1 series · 1 of 1 positions shown · non-contrast
Comparison: One view abdomen 04/08/2014 and ERCP from that date.

CLINICAL DATA: Biliary stent placement 2 weeks ago.

EXAM:
ABDOMEN - 1 VIEW

[view not recorded]
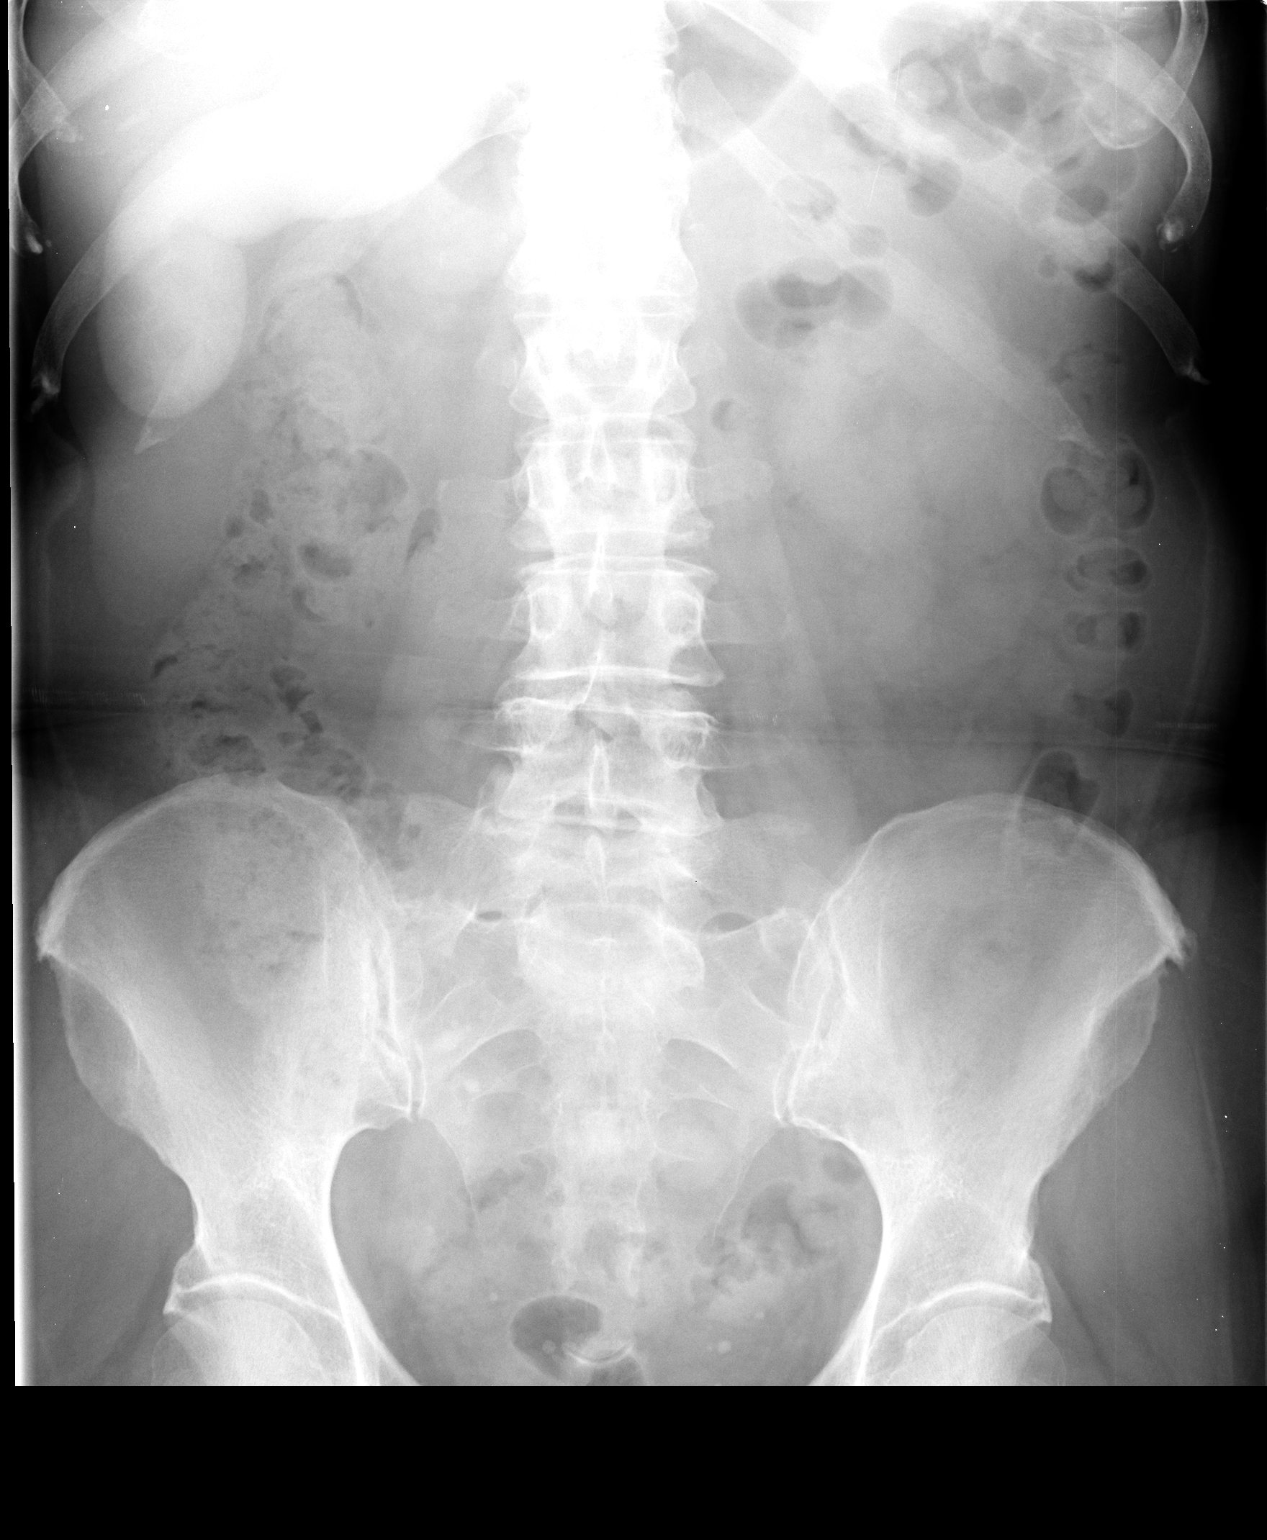

[1 of 1 positions shown; findings below may reference images not displayed]

FINDINGS: The previously demonstrated small caliber biliary stent is no longer
visualized. Portions of the upper abdomen are excluded from this
radiograph. There is contrast material within the gallbladder lumen.
Contrast outlines filling defects consistent with cholelithiasis.
The bowel gas pattern is normal. Pelvic phleboliths are grossly
stable.
IMPRESSION: Biliary stent is no longer visualized. There is persistent contrast
material and gallstones within the gallbladder lumen.

## 2016-11-19 ENCOUNTER — Other Ambulatory Visit: Payer: Self-pay | Admitting: Neurosurgery

## 2017-01-14 ENCOUNTER — Encounter (HOSPITAL_COMMUNITY)
Admission: RE | Admit: 2017-01-14 | Discharge: 2017-01-14 | Disposition: A | Discharge status: Home / Self Care | Payer: Medicare Other | Source: Ambulatory Visit | Attending: Neurosurgery | Admitting: Neurosurgery

## 2017-01-14 ENCOUNTER — Encounter (HOSPITAL_COMMUNITY): Payer: Self-pay

## 2017-01-14 DIAGNOSIS — Z01812 Encounter for preprocedural laboratory examination: Secondary | ICD-10-CM | POA: Insufficient documentation

## 2017-01-14 DIAGNOSIS — E7800 Pure hypercholesterolemia, unspecified: Secondary | ICD-10-CM | POA: Diagnosis not present

## 2017-01-14 HISTORY — DX: Headache, unspecified: R51.9

## 2017-01-14 HISTORY — DX: Headache: R51

## 2017-01-14 HISTORY — DX: Gastro-esophageal reflux disease without esophagitis: K21.9

## 2017-01-14 LAB — BASIC METABOLIC PANEL
Anion gap: 8 (ref 5–15)
BUN: 16 mg/dL (ref 6–20)
CO2: 25 mmol/L (ref 22–32)
Calcium: 9.3 mg/dL (ref 8.9–10.3)
Chloride: 106 mmol/L (ref 101–111)
Creatinine, Ser: 0.84 mg/dL (ref 0.44–1.00)
GFR calc Af Amer: >60 mL/min (ref >60)
GFR calc non Af Amer: >60 mL/min (ref >60)
Glucose, Bld: 80 mg/dL (ref 65–99)
Potassium: 4.1 mmol/L (ref 3.5–5.1)
Sodium: 139 mmol/L (ref 135–145)

## 2017-01-14 LAB — CBC
HCT: 42.2 % (ref 36.0–46.0)
Hemoglobin: 13.8 g/dL (ref 12.0–15.0)
MCH: 29.8 pg (ref 26.0–34.0)
MCHC: 32.7 g/dL (ref 30.0–36.0)
MCV: 91.1 fL (ref 78.0–100.0)
Platelets: 218 10*3/uL (ref 150–400)
RBC: 4.63 MIL/uL (ref 3.87–5.11)
RDW: 12.6 % (ref 11.5–15.5)
WBC: 6.2 10*3/uL (ref 4.0–10.5)

## 2017-01-14 LAB — TYPE AND SCREEN
ABO/RH(D): O POS
Antibody Screen: NEGATIVE

## 2017-01-14 LAB — SURGICAL PCR SCREEN
MRSA, PCR: NEGATIVE
Staphylococcus aureus: NEGATIVE

## 2017-01-14 LAB — ABO/RH: ABO/RH(D): O POS

## 2017-01-14 MED ORDER — CHLORHEXIDINE GLUCONATE CLOTH 2 % EX PADS: 6.0000 | MEDICATED_PAD | Freq: Once | CUTANEOUS | Status: DC

## 2017-01-14 NOTE — Pre-Procedure Instructions (Signed)
    [name]  01/14/2017      [id number] [place] [address] [place] [id number] Phone: [phone] Fax: [phone]  [facility] [address] [place], [place] [place] [id number] Phone: [phone] Fax: [phone]    Your procedure is scheduled on 01/21/17.  [facility]  Call this number if you have problems the morning of surgery:  [phone]   Remember:  Do not eat food or drink liquids after midnight.  Take these medicines the morning of surgery with A SIP OF WATER --xanax,estrace,protonix,ultram   Do not wear jewelry, make-up or nail polish.  Do not wear lotions, powders, or perfumes, or deoderant.  Do not shave 48 hours prior to surgery.  Men may shave face and neck.  Do not bring valuables to the hospital.  [facility] is not responsible for any belongings or valuables.  Contacts, dentures or bridgework may not be worn into surgery.  Leave your suitcase in the car.  After surgery it may be brought to your room.  For patients admitted to the hospital, discharge time will be determined by your treatment team.  Patients discharged the day of surgery will not be allowed to drive home.   Name and phone number of your driver:    Special instructions:  Do not take any aspirin,anti-inflammatories,vitamins,or herbal supplements 5-7 days prior to surgery.  Please read over the following fact sheets that you were given. MRSA Information

## 2017-01-17 NOTE — Progress Notes (Signed)
[place] attempting to [place]. Phone line busy at MD office.

## 2017-01-17 NOTE — Progress Notes (Signed)
Anesthesia Chart Review: Patient is a 71 year old female scheduled for right L3-4, L4-5 anterolateral lumbar interbody fusion on 01/21/17 by Dr. Venetia MaxonStern.   History includes never smoker, hypercholesterolemia, GERD, depression, headaches, nasal septum surgery, ERCP with sphincterotomy '15, cholecystectomy, hysterectomy.   PCP is Dr. Oscar Laaplan in [place], [place].  Meds include Xanax, Lipitor, Lexapro, estradiol, Flonase, Protonix, tramadol, trazodone.  BP (!) 150/64   Pulse (!) 50   Temp 36.7 C (Oral)   Resp 18   Ht 5\' 1"  (1.549 m)   Wt 141 lb 2 oz (64 kg)   SpO2 98%   BMI 26.67 kg/m   EKG 04/25/16 [facility]): SR, incomplete right BBB, LAD, voltage criteria for LVH, voltage criteria without S/ST abnormality, may be normal variant. EKG appears stable when compared with tracing from 04/07/14.   Preoperative labs noted. CBC and BMET WNL. T&S done.   If no acute changes then I anticipate that she can proceed as planned.  Velna Ochsllison Odeal Welden, PA-C [facility]esthesiology Phone [phone] 01/17/2017 3:23 PM

## 2017-01-20 ENCOUNTER — Encounter (HOSPITAL_COMMUNITY): Payer: Self-pay | Admitting: Anesthesiology

## 2017-01-20 NOTE — Anesthesia Preprocedure Evaluation (Addendum)
Anesthesia Evaluation  Patient identified by MRN, date of birth, ID band Patient awake    Reviewed: Allergy & Precautions, NPO status , Patient's Chart, lab work & pertinent test results  Airway Mallampati: III       Dental no notable dental hx. 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, L4-5   Abdominal   Peds  Hematology negative hematology ROS (+)   Anesthesia Other Findings   Reproductive/Obstetrics                            Anesthesia Physical Anesthesia Plan  ASA: II  Anesthesia Plan: General   Post-op Pain Management:    Induction: Intravenous  Airway Management Planned: Oral ETT  Additional Equipment:   Intra-op Plan:   Post-operative Plan: Extubation in OR  Informed Consent: I have reviewed the patients History and Physical, chart, labs and discussed the procedure including the risks, benefits and alternatives for the proposed anesthesia with the patient or authorized representative who has indicated his/her understanding and acceptance.     Plan Discussed with: CRNA, Anesthesiologist and Surgeon  Anesthesia Plan Comments:        Anesthesia Quick Evaluation

## 2017-01-21 ENCOUNTER — Inpatient Hospital Stay (HOSPITAL_COMMUNITY): Payer: Medicare Other | Admitting: Vascular Surgery

## 2017-01-21 ENCOUNTER — Inpatient Hospital Stay (HOSPITAL_COMMUNITY): Payer: Medicare Other

## 2017-01-21 ENCOUNTER — Encounter (HOSPITAL_COMMUNITY): Payer: Self-pay | Admitting: *Deleted

## 2017-01-21 ENCOUNTER — Encounter (HOSPITAL_COMMUNITY)
Admission: RE | Disposition: A | Discharge status: Home / Self Care | Payer: Self-pay | Source: Ambulatory Visit | Attending: Neurosurgery

## 2017-01-21 ENCOUNTER — Inpatient Hospital Stay (HOSPITAL_COMMUNITY)
Admission: RE | Admit: 2017-01-21 | Discharge: 2017-01-28 | DRG: 456 | Disposition: A | Discharge status: Home / Self Care | Payer: Medicare Other | Source: Ambulatory Visit | Attending: Neurosurgery | Admitting: Neurosurgery

## 2017-01-21 ENCOUNTER — Inpatient Hospital Stay (HOSPITAL_COMMUNITY): Payer: Medicare Other | Admitting: Anesthesiology

## 2017-01-21 DIAGNOSIS — K219 Gastro-esophageal reflux disease without esophagitis: Secondary | ICD-10-CM | POA: Diagnosis present

## 2017-01-21 DIAGNOSIS — M48061 Spinal stenosis, lumbar region without neurogenic claudication: Secondary | ICD-10-CM | POA: Diagnosis present

## 2017-01-21 DIAGNOSIS — J9690 Respiratory failure, unspecified, unspecified whether with hypoxia or hypercapnia: Secondary | ICD-10-CM | POA: Diagnosis not present

## 2017-01-21 DIAGNOSIS — N390 Urinary tract infection, site not specified: Secondary | ICD-10-CM | POA: Diagnosis not present

## 2017-01-21 DIAGNOSIS — M4316 Spondylolisthesis, lumbar region: Secondary | ICD-10-CM | POA: Diagnosis present

## 2017-01-21 DIAGNOSIS — R0600 Dyspnea, unspecified: Secondary | ICD-10-CM

## 2017-01-21 DIAGNOSIS — M419 Scoliosis, unspecified: Secondary | ICD-10-CM | POA: Diagnosis present

## 2017-01-21 DIAGNOSIS — E8770 Fluid overload, unspecified: Secondary | ICD-10-CM | POA: Diagnosis not present

## 2017-01-21 DIAGNOSIS — Z9049 Acquired absence of other specified parts of digestive tract: Secondary | ICD-10-CM

## 2017-01-21 DIAGNOSIS — J80 Acute respiratory distress syndrome: Secondary | ICD-10-CM | POA: Diagnosis not present

## 2017-01-21 DIAGNOSIS — F419 Anxiety disorder, unspecified: Secondary | ICD-10-CM | POA: Diagnosis present

## 2017-01-21 DIAGNOSIS — J9601 Acute respiratory failure with hypoxia: Secondary | ICD-10-CM | POA: Diagnosis not present

## 2017-01-21 DIAGNOSIS — Z79899 Other long term (current) drug therapy: Secondary | ICD-10-CM | POA: Diagnosis not present

## 2017-01-21 DIAGNOSIS — F329 Major depressive disorder, single episode, unspecified: Secondary | ICD-10-CM | POA: Diagnosis present

## 2017-01-21 DIAGNOSIS — J9811 Atelectasis: Secondary | ICD-10-CM | POA: Diagnosis not present

## 2017-01-21 DIAGNOSIS — E785 Hyperlipidemia, unspecified: Secondary | ICD-10-CM | POA: Diagnosis present

## 2017-01-21 DIAGNOSIS — R0989 Other specified symptoms and signs involving the circulatory and respiratory systems: Secondary | ICD-10-CM

## 2017-01-21 DIAGNOSIS — Z90710 Acquired absence of both cervix and uterus: Secondary | ICD-10-CM | POA: Diagnosis not present

## 2017-01-21 DIAGNOSIS — R262 Difficulty in walking, not elsewhere classified: Secondary | ICD-10-CM

## 2017-01-21 DIAGNOSIS — M5116 Intervertebral disc disorders with radiculopathy, lumbar region: Secondary | ICD-10-CM | POA: Diagnosis present

## 2017-01-21 DIAGNOSIS — I10 Essential (primary) hypertension: Secondary | ICD-10-CM | POA: Diagnosis present

## 2017-01-21 DIAGNOSIS — Z419 Encounter for procedure for purposes other than remedying health state, unspecified: Secondary | ICD-10-CM

## 2017-01-21 DIAGNOSIS — M4186 Other forms of scoliosis, lumbar region: Secondary | ICD-10-CM | POA: Diagnosis present

## 2017-01-21 DIAGNOSIS — J9602 Acute respiratory failure with hypercapnia: Secondary | ICD-10-CM | POA: Diagnosis not present

## 2017-01-21 DIAGNOSIS — M545 Low back pain: Secondary | ICD-10-CM | POA: Diagnosis present

## 2017-01-21 HISTORY — PX: LUMBAR PERCUTANEOUS PEDICLE SCREW 2 LEVEL: SHX5561

## 2017-01-21 HISTORY — PX: ANTERIOR LAT LUMBAR FUSION: SHX1168

## 2017-01-21 SURGERY — ANTERIOR LATERAL LUMBAR FUSION 2 LEVELS
Anesthesia: General | Laterality: Right

## 2017-01-21 MED ORDER — HEMOSTATIC AGENTS (NO CHARGE) OPTIME
TOPICAL | Status: DC | PRN
  Administered 2017-01-21: 1 via TOPICAL

## 2017-01-21 MED ORDER — ACETAMINOPHEN 325 MG PO TABS
650.0000 mg | ORAL_TABLET | ORAL | Status: DC | PRN
  Administered 2017-01-23 – 2017-01-24 (×3): 650 mg via ORAL
  Filled 2017-01-21 (×3): qty 2

## 2017-01-21 MED ORDER — CEFAZOLIN SODIUM-DEXTROSE 2-4 GM/100ML-% IV SOLN
2.0000 g | Freq: Three times a day (TID) | INTRAVENOUS | Status: AC
  Administered 2017-01-21 (×2): 2 g via INTRAVENOUS
  Filled 2017-01-21 (×2): qty 100

## 2017-01-21 MED ORDER — SUFENTANIL CITRATE 50 MCG/ML IV SOLN
INTRAVENOUS | Status: DC | PRN
  Administered 2017-01-21 (×2): 5 ug via INTRAVENOUS
  Administered 2017-01-21: 10 ug via INTRAVENOUS
  Administered 2017-01-21: 20 ug via INTRAVENOUS

## 2017-01-21 MED ORDER — BUPIVACAINE LIPOSOME 1.3 % IJ SUSP
20.0000 mL | INTRAMUSCULAR | Status: DC
  Filled 2017-01-21: qty 20

## 2017-01-21 MED ORDER — LIDOCAINE-EPINEPHRINE (PF) 2 %-1:200000 IJ SOLN
INTRAMUSCULAR | Status: AC
  Filled 2017-01-21: qty 20

## 2017-01-21 MED ORDER — MENTHOL 3 MG MT LOZG
1.0000 | LOZENGE | OROMUCOSAL | Status: DC | PRN
  Filled 2017-01-21: qty 9

## 2017-01-21 MED ORDER — MEPERIDINE HCL 25 MG/ML IJ SOLN
INTRAMUSCULAR | Status: AC
  Filled 2017-01-21: qty 1

## 2017-01-21 MED ORDER — EPHEDRINE 5 MG/ML INJ
INTRAVENOUS | Status: AC
  Filled 2017-01-21: qty 10

## 2017-01-21 MED ORDER — LIDOCAINE 2% (20 MG/ML) 5 ML SYRINGE
INTRAMUSCULAR | Status: AC
  Filled 2017-01-21: qty 5

## 2017-01-21 MED ORDER — ESTRADIOL 1 MG PO TABS
0.5000 mg | ORAL_TABLET | ORAL | Status: DC
  Administered 2017-01-23 – 2017-01-27 (×3): 0.5 mg via ORAL
  Filled 2017-01-21 (×3): qty 0.5

## 2017-01-21 MED ORDER — PROPOFOL 10 MG/ML IV BOLUS
INTRAVENOUS | Status: DC | PRN
  Administered 2017-01-21: 120 mg via INTRAVENOUS

## 2017-01-21 MED ORDER — THROMBIN 5000 UNITS EX SOLR
CUTANEOUS | Status: DC | PRN
  Administered 2017-01-21 (×2): 5000 [IU] via TOPICAL

## 2017-01-21 MED ORDER — MEPERIDINE HCL 25 MG/ML IJ SOLN
6.2500 mg | INTRAMUSCULAR | Status: DC | PRN
  Administered 2017-01-21: 12.5 mg via INTRAVENOUS
  Administered 2017-01-21 (×2): 6.25 mg via INTRAVENOUS

## 2017-01-21 MED ORDER — GLYCOPYRROLATE 0.2 MG/ML IJ SOLN
INTRAMUSCULAR | Status: DC | PRN
  Administered 2017-01-21 (×2): 0.2 mg via INTRAVENOUS

## 2017-01-21 MED ORDER — METHOCARBAMOL 500 MG PO TABS
500.0000 mg | ORAL_TABLET | Freq: Four times a day (QID) | ORAL | Status: DC | PRN
  Administered 2017-01-21 – 2017-01-27 (×6): 500 mg via ORAL
  Filled 2017-01-21 (×5): qty 1

## 2017-01-21 MED ORDER — FLUTICASONE PROPIONATE 50 MCG/ACT NA SUSP: 1.0000 | Freq: Every day | NASAL | Status: DC | PRN

## 2017-01-21 MED ORDER — THROMBIN 5000 UNITS EX SOLR
CUTANEOUS | Status: AC
  Filled 2017-01-21: qty 10000

## 2017-01-21 MED ORDER — DOCUSATE SODIUM 100 MG PO CAPS
100.0000 mg | ORAL_CAPSULE | Freq: Two times a day (BID) | ORAL | Status: DC
  Administered 2017-01-21 – 2017-01-27 (×12): 100 mg via ORAL
  Filled 2017-01-21 (×12): qty 1

## 2017-01-21 MED ORDER — MORPHINE SULFATE (PF) 4 MG/ML IV SOLN
1.0000 mg | INTRAVENOUS | Status: DC | PRN
  Administered 2017-01-24 – 2017-01-25 (×3): 2 mg via INTRAVENOUS
  Administered 2017-01-26: 4 mg via INTRAVENOUS
  Administered 2017-01-26 (×2): 2 mg via INTRAVENOUS
  Administered 2017-01-26 – 2017-01-27 (×3): 4 mg via INTRAVENOUS
  Filled 2017-01-21 (×9): qty 1

## 2017-01-21 MED ORDER — MORPHINE SULFATE (PF) 2 MG/ML IV SOLN
1.0000 mg | INTRAVENOUS | Status: DC | PRN
  Administered 2017-01-21: 4 mg via INTRAVENOUS

## 2017-01-21 MED ORDER — EPHEDRINE SULFATE 50 MG/ML IJ SOLN
INTRAMUSCULAR | Status: DC | PRN
  Administered 2017-01-21: 10 mg via INTRAVENOUS
  Administered 2017-01-21 (×2): 5 mg via INTRAVENOUS
  Administered 2017-01-21: 10 mg via INTRAVENOUS
  Administered 2017-01-21: 15 mg via INTRAVENOUS

## 2017-01-21 MED ORDER — MORPHINE SULFATE (PF) 4 MG/ML IV SOLN
INTRAVENOUS | Status: AC
  Filled 2017-01-21: qty 1

## 2017-01-21 MED ORDER — METOCLOPRAMIDE HCL 5 MG/ML IJ SOLN
INTRAMUSCULAR | Status: AC
  Filled 2017-01-21: qty 2

## 2017-01-21 MED ORDER — ONDANSETRON HCL 4 MG/2ML IJ SOLN
INTRAMUSCULAR | Status: DC | PRN
  Administered 2017-01-21: 4 mg via INTRAVENOUS

## 2017-01-21 MED ORDER — POLYETHYLENE GLYCOL 3350 17 G PO PACK: 17.0000 g | PACK | Freq: Every day | ORAL | Status: DC | PRN

## 2017-01-21 MED ORDER — DEXAMETHASONE SODIUM PHOSPHATE 10 MG/ML IJ SOLN
INTRAMUSCULAR | Status: AC
  Filled 2017-01-21: qty 1

## 2017-01-21 MED ORDER — KCL IN DEXTROSE-NACL 20-5-0.45 MEQ/L-%-% IV SOLN
INTRAVENOUS | Status: DC
  Administered 2017-01-21: 14:00:00 via INTRAVENOUS

## 2017-01-21 MED ORDER — ALPRAZOLAM 0.5 MG PO TABS
0.5000 mg | ORAL_TABLET | Freq: Two times a day (BID) | ORAL | Status: DC
  Administered 2017-01-21 – 2017-01-22 (×3): 0.5 mg via ORAL
  Filled 2017-01-21 (×3): qty 1

## 2017-01-21 MED ORDER — HYDROCODONE-ACETAMINOPHEN 5-325 MG PO TABS: 1.0000 | ORAL_TABLET | ORAL | Status: DC | PRN

## 2017-01-21 MED ORDER — ONDANSETRON HCL 4 MG/2ML IJ SOLN
INTRAMUSCULAR | Status: AC
  Filled 2017-01-21: qty 2

## 2017-01-21 MED ORDER — LIDOCAINE-EPINEPHRINE (PF) 2 %-1:200000 IJ SOLN
INTRAMUSCULAR | Status: DC | PRN
  Administered 2017-01-21: 5 mL via INTRADERMAL

## 2017-01-21 MED ORDER — TRAZODONE HCL 100 MG PO TABS
100.0000 mg | ORAL_TABLET | Freq: Two times a day (BID) | ORAL | Status: DC
  Administered 2017-01-21 – 2017-01-22 (×3): 100 mg via ORAL
  Filled 2017-01-21 (×5): qty 1

## 2017-01-21 MED ORDER — SUCCINYLCHOLINE CHLORIDE 200 MG/10ML IV SOSY
PREFILLED_SYRINGE | INTRAVENOUS | Status: DC | PRN
  Administered 2017-01-21: 100 mg via INTRAVENOUS

## 2017-01-21 MED ORDER — FLEET ENEMA 7-19 GM/118ML RE ENEM: 1.0000 | ENEMA | Freq: Once | RECTAL | Status: DC | PRN

## 2017-01-21 MED ORDER — ACETAMINOPHEN 650 MG RE SUPP: 650.0000 mg | RECTAL | Status: DC | PRN

## 2017-01-21 MED ORDER — CEFAZOLIN SODIUM-DEXTROSE 2-4 GM/100ML-% IV SOLN
2.0000 g | INTRAVENOUS | Status: AC
  Administered 2017-01-21: 2 g via INTRAVENOUS
  Filled 2017-01-21: qty 100

## 2017-01-21 MED ORDER — PANTOPRAZOLE SODIUM 40 MG PO TBEC
40.0000 mg | DELAYED_RELEASE_TABLET | Freq: Every day | ORAL | Status: DC
  Administered 2017-01-21 – 2017-01-27 (×7): 40 mg via ORAL
  Filled 2017-01-21 (×7): qty 1

## 2017-01-21 MED ORDER — ONDANSETRON HCL 4 MG/2ML IJ SOLN: 4.0000 mg | INTRAMUSCULAR | Status: DC | PRN

## 2017-01-21 MED ORDER — METOCLOPRAMIDE HCL 5 MG/ML IJ SOLN
INTRAMUSCULAR | Status: DC | PRN
  Administered 2017-01-21: 10 mg via INTRAVENOUS

## 2017-01-21 MED ORDER — PANTOPRAZOLE SODIUM 40 MG PO TBEC: 40.0000 mg | DELAYED_RELEASE_TABLET | Freq: Every day | ORAL | Status: DC

## 2017-01-21 MED ORDER — OXYCODONE-ACETAMINOPHEN 5-325 MG PO TABS
1.0000 | ORAL_TABLET | ORAL | Status: DC | PRN
  Administered 2017-01-21: 2 via ORAL
  Filled 2017-01-21: qty 2

## 2017-01-21 MED ORDER — ZOLPIDEM TARTRATE 5 MG PO TABS: 5.0000 mg | ORAL_TABLET | Freq: Every evening | ORAL | Status: DC | PRN

## 2017-01-21 MED ORDER — MIDAZOLAM HCL 2 MG/2ML IJ SOLN
INTRAMUSCULAR | Status: AC
  Filled 2017-01-21: qty 2

## 2017-01-21 MED ORDER — BUPIVACAINE HCL (PF) 0.5 % IJ SOLN
INTRAMUSCULAR | Status: DC | PRN
  Administered 2017-01-21: 5 mL

## 2017-01-21 MED ORDER — KCL IN DEXTROSE-NACL 20-5-0.45 MEQ/L-%-% IV SOLN
INTRAVENOUS | Status: AC
  Filled 2017-01-21: qty 1000

## 2017-01-21 MED ORDER — PROPOFOL 10 MG/ML IV BOLUS
INTRAVENOUS | Status: AC
  Filled 2017-01-21: qty 20

## 2017-01-21 MED ORDER — SODIUM CHLORIDE 0.9% FLUSH: 3.0000 mL | INTRAVENOUS | Status: DC | PRN

## 2017-01-21 MED ORDER — BUPIVACAINE LIPOSOME 1.3 % IJ SUSP
INTRAMUSCULAR | Status: DC | PRN
  Administered 2017-01-21: 20 mL

## 2017-01-21 MED ORDER — MIDAZOLAM HCL 5 MG/5ML IJ SOLN
INTRAMUSCULAR | Status: DC | PRN
  Administered 2017-01-21: 2 mg via INTRAVENOUS

## 2017-01-21 MED ORDER — ATORVASTATIN CALCIUM 20 MG PO TABS
20.0000 mg | ORAL_TABLET | Freq: Every day | ORAL | Status: DC
  Administered 2017-01-21 – 2017-01-27 (×7): 20 mg via ORAL
  Filled 2017-01-21 (×7): qty 1

## 2017-01-21 MED ORDER — TRAMADOL HCL 50 MG PO TABS
50.0000 mg | ORAL_TABLET | Freq: Every day | ORAL | Status: DC | PRN
  Administered 2017-01-23 – 2017-01-24 (×2): 50 mg via ORAL
  Filled 2017-01-21 (×2): qty 1

## 2017-01-21 MED ORDER — LACTATED RINGERS IV SOLN
INTRAVENOUS | Status: DC | PRN
  Administered 2017-01-21 (×3): via INTRAVENOUS

## 2017-01-21 MED ORDER — PHENYLEPHRINE 40 MCG/ML (10ML) SYRINGE FOR IV PUSH (FOR BLOOD PRESSURE SUPPORT)
PREFILLED_SYRINGE | INTRAVENOUS | Status: DC | PRN
  Administered 2017-01-21 (×4): 80 ug via INTRAVENOUS

## 2017-01-21 MED ORDER — SUFENTANIL CITRATE 50 MCG/ML IV SOLN
INTRAVENOUS | Status: AC
  Filled 2017-01-21: qty 1

## 2017-01-21 MED ORDER — PHENOL 1.4 % MT LIQD: 1.0000 | OROMUCOSAL | Status: DC | PRN

## 2017-01-21 MED ORDER — ESCITALOPRAM OXALATE 10 MG PO TABS
10.0000 mg | ORAL_TABLET | Freq: Every day | ORAL | Status: DC
  Administered 2017-01-22 – 2017-01-27 (×6): 10 mg via ORAL
  Filled 2017-01-21 (×6): qty 1

## 2017-01-21 MED ORDER — PANTOPRAZOLE SODIUM 40 MG IV SOLR: 40.0000 mg | Freq: Every day | INTRAVENOUS | Status: DC

## 2017-01-21 MED ORDER — SODIUM CHLORIDE 0.9% FLUSH
3.0000 mL | Freq: Two times a day (BID) | INTRAVENOUS | Status: DC
  Administered 2017-01-21 – 2017-01-27 (×9): 3 mL via INTRAVENOUS

## 2017-01-21 MED ORDER — 0.9 % SODIUM CHLORIDE (POUR BTL) OPTIME
TOPICAL | Status: DC | PRN
  Administered 2017-01-21: 1000 mL

## 2017-01-21 MED ORDER — LIDOCAINE 2% (20 MG/ML) 5 ML SYRINGE
INTRAMUSCULAR | Status: DC | PRN
  Administered 2017-01-21: 100 mg via INTRAVENOUS

## 2017-01-21 MED ORDER — HYDROCODONE-ACETAMINOPHEN 10-325 MG PO TABS
1.0000 | ORAL_TABLET | ORAL | Status: DC | PRN
  Administered 2017-01-21 – 2017-01-22 (×6): 2 via ORAL
  Administered 2017-01-23: 1 via ORAL
  Filled 2017-01-21 (×5): qty 2
  Filled 2017-01-21: qty 1
  Filled 2017-01-21: qty 2

## 2017-01-21 MED ORDER — METHOCARBAMOL 500 MG PO TABS
ORAL_TABLET | ORAL | Status: AC
  Filled 2017-01-21: qty 1

## 2017-01-21 MED ORDER — DEXAMETHASONE SODIUM PHOSPHATE 10 MG/ML IJ SOLN
INTRAMUSCULAR | Status: DC | PRN
  Administered 2017-01-21: 5 mg via INTRAVENOUS

## 2017-01-21 MED ORDER — METHOCARBAMOL 1000 MG/10ML IJ SOLN: 500.0000 mg | Freq: Four times a day (QID) | INTRAMUSCULAR | Status: DC | PRN

## 2017-01-21 MED ORDER — BISACODYL 10 MG RE SUPP
10.0000 mg | Freq: Every day | RECTAL | Status: DC | PRN
  Filled 2017-01-21: qty 1

## 2017-01-21 MED ORDER — SUCCINYLCHOLINE CHLORIDE 200 MG/10ML IV SOSY
PREFILLED_SYRINGE | INTRAVENOUS | Status: AC
  Filled 2017-01-21: qty 10

## 2017-01-21 MED ORDER — PHENYLEPHRINE 40 MCG/ML (10ML) SYRINGE FOR IV PUSH (FOR BLOOD PRESSURE SUPPORT)
PREFILLED_SYRINGE | INTRAVENOUS | Status: AC
  Filled 2017-01-21: qty 10

## 2017-01-21 MED ORDER — SODIUM CHLORIDE 0.9 % IJ SOLN
INTRAMUSCULAR | Status: AC
  Filled 2017-01-21: qty 10

## 2017-01-21 MED ORDER — METOCLOPRAMIDE HCL 5 MG/ML IJ SOLN: 10.0000 mg | Freq: Once | INTRAMUSCULAR | Status: DC | PRN

## 2017-01-21 SURGICAL SUPPLY — 80 items
BLADE CLIPPER SURG (BLADE) IMPLANT
CAGE MODULUS XL 10X18X55 - 10 (Cage) ×3 IMPLANT
CARTRIDGE OIL MAESTRO DRILL (MISCELLANEOUS) IMPLANT
CLIP NEUROVISION LG (CLIP) ×3 IMPLANT
CONT SPEC 4OZ CLIKSEAL STRL BL (MISCELLANEOUS) ×3 IMPLANT
COVER BACK TABLE 24X17X13 BIG (DRAPES) IMPLANT
COVER BACK TABLE 60X90IN (DRAPES) IMPLANT
DECANTER SPIKE VIAL GLASS SM (MISCELLANEOUS) ×6 IMPLANT
DERMABOND ADVANCED (GAUZE/BANDAGES/DRESSINGS) ×8
DERMABOND ADVANCED .7 DNX12 (GAUZE/BANDAGES/DRESSINGS) ×4 IMPLANT
DIFFUSER DRILL AIR PNEUMATIC (MISCELLANEOUS) IMPLANT
DRAPE C-ARM 42X72 X-RAY (DRAPES) ×6 IMPLANT
DRAPE C-ARMOR (DRAPES) ×12 IMPLANT
DRAPE LAPAROTOMY 100X72X124 (DRAPES) ×6 IMPLANT
DRAPE POUCH INSTRU U-SHP 10X18 (DRAPES) ×6 IMPLANT
DRAPE SURG 17X23 STRL (DRAPES) ×3 IMPLANT
DRSG OPSITE POSTOP 3X4 (GAUZE/BANDAGES/DRESSINGS) ×6 IMPLANT
DRSG OPSITE POSTOP 4X6 (GAUZE/BANDAGES/DRESSINGS) ×9 IMPLANT
DURAPREP 26ML APPLICATOR (WOUND CARE) ×6 IMPLANT
ELECT REM PT RETURN 9FT ADLT (ELECTROSURGICAL) ×6
ELECTRODE REM PT RTRN 9FT ADLT (ELECTROSURGICAL) ×2 IMPLANT
GAUZE SPONGE 4X4 12PLY STRL (GAUZE/BANDAGES/DRESSINGS) ×3 IMPLANT
GAUZE SPONGE 4X4 16PLY XRAY LF (GAUZE/BANDAGES/DRESSINGS) ×3 IMPLANT
GLOVE BIO SURGEON STRL SZ8 (GLOVE) ×6 IMPLANT
GLOVE BIOGEL PI IND STRL 7.0 (GLOVE) ×1 IMPLANT
GLOVE BIOGEL PI IND STRL 8 (GLOVE) ×2 IMPLANT
GLOVE BIOGEL PI IND STRL 8.5 (GLOVE) ×2 IMPLANT
GLOVE BIOGEL PI INDICATOR 7.0 (GLOVE) ×2
GLOVE BIOGEL PI INDICATOR 8 (GLOVE) ×4
GLOVE BIOGEL PI INDICATOR 8.5 (GLOVE) ×4
GLOVE ECLIPSE 7.5 STRL STRAW (GLOVE) ×6 IMPLANT
GLOVE ECLIPSE 8.0 STRL XLNG CF (GLOVE) IMPLANT
GLOVE EXAM NITRILE LRG STRL (GLOVE) IMPLANT
GLOVE EXAM NITRILE XL STR (GLOVE) IMPLANT
GLOVE EXAM NITRILE XS STR PU (GLOVE) IMPLANT
GOWN STRL REUS W/ TWL LRG LVL3 (GOWN DISPOSABLE) IMPLANT
GOWN STRL REUS W/ TWL XL LVL3 (GOWN DISPOSABLE) ×1 IMPLANT
GOWN STRL REUS W/TWL 2XL LVL3 (GOWN DISPOSABLE) IMPLANT
GOWN STRL REUS W/TWL LRG LVL3 (GOWN DISPOSABLE)
GOWN STRL REUS W/TWL XL LVL3 (GOWN DISPOSABLE) ×2
GUIDEWIRE NITINOL BEVEL TIP (WIRE) ×18 IMPLANT
KIT BASIN OR (CUSTOM PROCEDURE TRAY) ×3 IMPLANT
KIT DILATOR XLIF 5 (KITS) ×1 IMPLANT
KIT INFUSE X SMALL 1.4CC (Orthopedic Implant) ×3 IMPLANT
KIT POSITION SURG JACKSON T1 (MISCELLANEOUS) ×3 IMPLANT
KIT ROOM TURNOVER OR (KITS) ×3 IMPLANT
KIT SURGICAL ACCESS MAXCESS 4 (KITS) ×3 IMPLANT
KIT XLIF (KITS) ×2
MARKER SKIN DUAL TIP RULER LAB (MISCELLANEOUS) ×6 IMPLANT
MODULE NVM5 NEXT GEN EMG (NEEDLE) ×3 IMPLANT
MODULUS XLW 10X22X50MM 15DEG (Spine Construct) ×2 IMPLANT
NEEDLE DIAMOND SPRINGLESS III (NEEDLE) ×3 IMPLANT
NEEDLE HYPO 25X1 1.5 SAFETY (NEEDLE) ×3 IMPLANT
NS IRRIG 1000ML POUR BTL (IV SOLUTION) ×3 IMPLANT
OIL CARTRIDGE MAESTRO DRILL (MISCELLANEOUS)
PACK LAMINECTOMY NEURO (CUSTOM PROCEDURE TRAY) ×6 IMPLANT
PAD ARMBOARD 7.5X6 YLW CONV (MISCELLANEOUS) ×9 IMPLANT
PATTIES SURGICAL .5 X.5 (GAUZE/BANDAGES/DRESSINGS) IMPLANT
PATTIES SURGICAL .5 X1 (DISPOSABLE) IMPLANT
PATTIES SURGICAL 1X1 (DISPOSABLE) IMPLANT
PUTTY BONE ATTRAX 10CC STRIP (Putty) ×3 IMPLANT
ROD RELINE TI MAS LD 5.5X65 (Rod) ×6 IMPLANT
SCREW LOCK RELINE 5.5 TULIP (Screw) ×18 IMPLANT
SCREW MAS RELINE POLY 6.5X40 (Screw) ×12 IMPLANT
SCREW SHANK MAS MOD 6.5X40MM (Screw) ×6 IMPLANT
SPINE TULIP RELINE MOD (Neuro Prosthesis/Implant) ×6 IMPLANT
SPONGE LAP 4X18 X RAY DECT (DISPOSABLE) IMPLANT
SPONGE SURGIFOAM ABS GEL SZ50 (HEMOSTASIS) ×3 IMPLANT
STAPLER SKIN PROX WIDE 3.9 (STAPLE) ×6 IMPLANT
SUT VIC AB 1 CT1 18XBRD ANBCTR (SUTURE) ×2 IMPLANT
SUT VIC AB 1 CT1 8-18 (SUTURE) ×4
SUT VIC AB 2-0 CT1 18 (SUTURE) ×9 IMPLANT
SUT VIC AB 2-0 CT2 18 VCP726D (SUTURE) ×3 IMPLANT
SUT VIC AB 3-0 SH 8-18 (SUTURE) ×12 IMPLANT
SYR INSULIN 1ML 31GX6 SAFETY (SYRINGE) IMPLANT
TOWEL OR 17X24 6PK STRL BLUE (TOWEL DISPOSABLE) ×3 IMPLANT
TOWEL OR 17X26 10 PK STRL BLUE (TOWEL DISPOSABLE) ×6 IMPLANT
TRAY FOLEY W/METER SILVER 16FR (SET/KITS/TRAYS/PACK) ×3 IMPLANT
TULIP SPINE RELINE MOD (Neuro Prosthesis/Implant) ×2 IMPLANT
WATER STERILE IRR 1000ML POUR (IV SOLUTION) ×3 IMPLANT

## 2017-01-21 NOTE — Progress Notes (Signed)
Orthopedic Tech Progress Note Patient Details:  Julie [name] 16-Jul-1946 [id number] Patient already has brace. Patient ID: Julie [name], female   DOB: 16-Jul-1946, 71 y.o.   MRN: [id number]   Julie [name], Julie [name] 01/21/2017, 4:00 PM

## 2017-01-21 NOTE — H&P (Signed)
Patient ID:   [id number] Patient: Julie [name]  Date of Birth: 1946/03/14 Visit Type: Office Visit   Date: 11/18/2016 02:00 PM Provider: Danae Orleans. Venetia Maxon MD   This 71 year old female presents for back pain.  History of Present Illness: 1.  back pain    The patient comes in to review the results of her bone density scan.   11/15/16 bone density scan reveals a t-score of -1.8. This patient is considered to have low bone mass according to World Health Organization [facility]) criteria. Bone density is between 10 and 25% below young normal. Treatment is advised.       PAST MEDICAL HISTORY, SURGICAL HISTORY, FAMILY HISTORY, SOCIAL HISTORY AND REVIEW OF SYSTEMS  10/28/2016, which I have signed.   MEDICATIONS(added, continued or stopped this visit): Started Medication Directions Instruction Stopped   atorvastatin 20 mg tablet take 1 tablet by oral route  every day     Divigel 0.5 mg/0.5 gram (0.1 %) transdermal gel packet apply 1 packet by topical route  every day to upper thigh     escitalopram 10 mg tablet take 1 tablet by oral route  every day     Mobic 15 mg tablet take 1 tablet by oral route  every day     pantoprazole 40 mg tablet,delayed release take 1 tablet by oral route  every day    10/28/2016 tramadol 50 mg tablet take 1 tablet by oral route  every 6 hours as needed     trazodone 100 mg tablet take 1 tablet by oral route  every bedtime     Xanax 0.5 mg tablet take 1 tablet by oral route 3 times every day       ALLERGIES: Ingredient Reaction Medication Name Comment  NO KNOWN ALLERGIES     No known allergies.    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 Therefore, she is an acceptable candidate for surgery and we will proceed with the L3-4, L4-5 XLIF like we had discussed on her previous visit.   Schedule  L3-4, L4-5 XLIF. Nurse education given. Fitted for LSO brace.              Provider:  Danae Orleans. Venetia Maxon MD  11/18/2016 02:40 PM Dictation edited by: Gabriel Rainwater    Patient ID:   [id number] Patient: Julie [name]  Date of Birth: June 23, 1946 Visit Type: Office Visit   Date: 10/28/2016 01:00 PM Provider: Danae Orleans. Venetia Maxon MD   This 71 year old female presents for back pain.  History of Present Illness: 1.  back pain    Julie [name], 71 year old retired female visits for evaluation of lumbar and bilateral leg pain left>right.  She recalls pain onset 2015 without recollection of injury.  Physical therapy and epidural injections have offered no relief. She reports her leg pain is worse than her back pain. Her left leg pain radiates into her big toe and right leg pain only goes to her thigh and shin. Her pain is much worse when walking and can only walk a couple blocks or 15 minutes before her pain starts. She reports her pain gets 50% better when she is sitting. Pain is 8/10 at its worst in bilateral legs and 6/10 in her back.   PT 2015 offered no  relief ESI's 20173 offered no relief  Mobic 15 mg one per day  History: Reflux, cholesterol, depression Surgical history: bladder sling, deviated septum repair, cholecystectomy all greater than 10 years ago  X-rays on Canopy reveal mild levoconvex scoliosis, L4-5, L5-S1 degenerative changes and arthritis, L4-L5 retrolisthesis of 4.7 mm in neutral, fusion of L5 and S1 vertebrae  07/15/16 MRI: Areas of thecal sac stenosis at L3-4 and L4-5, mild. Areas of neural foraminal narrowing at L4-5 and L5-S1 with possible exiting nerve root compromise.        PAST MEDICAL/SURGICAL HISTORY   (Detailed)  Disease/disorder Onset Date Management Date Comments    Hysterectomy, vaginal  [facility] 10/25/2016 -    Cholecystectomy    Anxiety      Arthritis    [facility] 10/25/2016 -  Depression      GERD         PAST MEDICAL HISTORY, SURGICAL HISTORY,  FAMILY HISTORY, SOCIAL HISTORY AND REVIEW OF SYSTEMS  10/28/2016, which I have signed.  Family History  (Detailed)   SOCIAL HISTORY  (Detailed) Tobacco use reviewed. Preferred language is Unknown.   Smoking status: Never smoker.  SMOKING STATUS Use Status Type Smoking Status Usage Per Day Years Used Total Pack Years  no/never  Never smoker             MEDICATIONS(added, continued or stopped this visit): Started Medication Directions Instruction Stopped   atorvastatin 20 mg tablet take 1 tablet by oral route  every day     Divigel 0.5 mg/0.5 gram (0.1 %) transdermal gel packet apply 1 packet by topical route  every day to upper thigh     escitalopram 10 mg tablet take 1 tablet by oral route  every day     Mobic 15 mg tablet take 1 tablet by oral route  every day     pantoprazole 40 mg tablet,delayed release take 1 tablet by oral route  every day    10/28/2016 tramadol 50 mg tablet take 1 tablet by oral route  every 6 hours as needed     trazodone 100 mg tablet take 1 tablet by oral route  every bedtime     Xanax 0.5 mg tablet take 1 tablet by oral route 3 times every day       ALLERGIES: Ingredient Reaction Medication Name Comment  NO KNOWN ALLERGIES     No known allergies.    Vitals Date Temp F BP Pulse Ht In Wt Lb BMI BSA Pain Score  10/28/2016  173/83 58 61 147 27.78  5/10     PHYSICAL EXAM General Level of Distress: no acute distress Overall Appearance: normal    Cardiovascular Cardiac: regular rate and rhythm without murmur  Respiratory Lungs: clear to auscultation  Neurological Recent and Remote Memory: normal Attention Span and Concentration:   normal Language: normal Fund of Knowledge: normal  Right Left Sensation: normal normal Upper Extremity Coordination: normal normal  Lower Extremity Coordination: normal normal  Musculoskeletal Gait and Station: normal  Right Left Upper Extremity Muscle Strength: normal normal Lower Extremity  Muscle Strength: normal normal Upper Extremity Muscle Tone:  normal normal Lower Extremity Muscle Tone: normal normal  Motor Strength Upper and lower extremity motor strength was tested in the clinically pertinent muscles.     Deep Tendon Reflexes  Right Left Biceps: normal normal Triceps: normal normal Brachiloradialis: normal normal Patellar: normal normal Achilles: normal normal  Sensory Sensation was tested at L1 to S1.   Cranial Nerves II. Optic Nerve/Visual Fields:  normal III. Oculomotor: normal IV. Trochlear: normal V. Trigeminal: normal VI. Abducens: normal VII. Facial: normal VIII. Acoustic/Vestibular: normal IX. Glossopharyngeal: normal X. Vagus: normal XI. Spinal Accessory: normal XII. Hypoglossal: normal  Motor and other Tests Lhermittes: negative Rhomberg: negative    Right Left Hoffman's: normal normal Clonus: normal normal Babinski: normal normal SLR: positive positive Patrick's Pearlean Brownie): negative negative Toe Walk: normal normal Toe Lift: normal normal Heel Walk: normal normal SI Joint: nontender nontender   Additional Findings:  No pain to palpation on low back or sciatic notch discomfort, toe touch to the floor, UE and LE strength is normal, symmetric reflexes   DIAGNOSTIC RESULTS X-rays on Canopy reveal mild levoconvex scoliosis, L3-4, L4-5 degenerative changes and arthritis, L4-L5 retrolisthesis of 4.7 mm in neutral, fusion of L5 and S1 vertebrae  07/15/16 MRI: Areas of thecal sac stenosis at L3-4 and L4-5, mild. Areas of neural foraminal narrowing at L4-5 and L5-S1 with possible exiting nerve root compromise.    IMPRESSION The patient is experiencing low back and left>right leg pain. On review of her imaging, she has a mild levoconvex scoliosis, degeneration of L3-4 and L4-5, and neural foraminal narrowing at L3-4 on the right and L4-5 on the left that is causing nerve root compression. Her L5 and S1 vertebrae have also fused, but this  finding is stable. On confrontational testing, her strength and reflexes are normal, but she does have a positive SLR bilaterally and significant pain to the lateral aspect of her left knee on Patricks testing. I would recommend an L3-4, L4-5 XLIF for alleviation of her low back and bilateral leg symptoms, but I would like for her to get a bone density scan first to see if her bones are strong enough for surgery. We also got scoliosis and bilateral hip X-rays today for further workup of her levoconvex scoliosis and abnormal Patricks test on the left. I will prescribe her with stronger medication for additional pain relief.   Completed Orders (this encounter) Order Details Reason Side Interpretation Result Initial Treatment Date Region  Lumbar Spine- AP/Lat/Obls/Spot/Flex/Ex      10/28/2016 All Levels to All Levels  Lifestyle education follow up with primary physcian for elevated blood pressure.        Giving encouragement to exercise Encourage patient to eat a well balanced diet and follow up with primary physcian.        Scoliosis- AP/Lat 1 of 2 AND 3V BILAT HIPS AND 7V L/S     10/28/2016 All Levels to All Levels  Hip - Bilateral Lateral W/AP Pelvis 2 of 2 and 2V SCOLIOSIS and 7V L/S     10/28/2016 All Levels to All Levels   Assessment/Plan # Detail Type Description   1. Assessment Lumbar radiculopathy (M54.16).   Plan Orders Bone Density.       2. Assessment Essential (primary) hypertension (I10).       3. Assessment Body mass index (BMI) 27.0-27.9, adult (Z61.09).   Plan Orders Today's instructions / counseling include(s) Giving encouragement to exercise.       4. Assessment Scoliosis (and kyphoscoliosis), idiopathic (M41.20).       5. Assessment Hip pain, bilateral (M25.551).       6. Assessment Pain in left hip (M25.552).         Pain Assessment/Treatment Pain Scale: 5/10. Method: Numeric Pain Intensity Scale. Location: back. Onset: 11/26/2014. Duration: varies. Quality:  discomforting. Pain Assessment/Treatment follow-up plan of care: Pateint taking medication as prescribed..  Prescribed tramadol. Ordered bone density scan.  We got scoliosis and bilateral hip X-rays done today. Follow up after to discuss. If bone density results are normal, we will schedule an L3-4, L4-5 XLIF. Nurse education given.  Orders: Office Procedures/Services: Assessment Service Comments  M54.16 Bone Density    Diagnostic Procedures: Assessment Procedure  M25.551 Hip - Bilateral Lateral W/AP Pelvis  M41.20 Scoliosis- AP/Lat  M54.16 Lumbar Spine- AP/Lat/Obls/Spot/Flex/Ex  Instruction(s)/Education: Assessment Instruction  I10 Lifestyle education  [phone] Giving encouragement to exercise    MEDICATIONS PRESCRIBED TODAY    Rx Quantity Refills  TRAMADOL HCL 50 mg  60 0            Provider:  Danae Orleans. Venetia Maxon MD  10/28/2016 02:42 PM Dictation edited by: Gabriel Rainwater    CC Providers: Andi Hence [address] [place], [place] [address]-              Electronically signed by Danae Orleans. Venetia Maxon MD on 10/28/2016 04:33 PM

## 2017-01-21 NOTE — Anesthesia Postprocedure Evaluation (Signed)
Anesthesia Post Note  Patient: Linard Millersatricia M Osso  Procedure(s) Performed: Procedure(s) (LRB): RIGHT LUMBAR THREE-FOUR LUMBAR FOUR - FIVE ANTEROLATERAL LUMBAR INTERBODY FUSION WITH PERCUTANEOUS PEDICLE SCREWS (Right) LUMBAR PERCUTANEOUS PEDICLE SCREW 2 LEVEL (Right)  Patient location during evaluation: PACU Anesthesia Type: General Level of consciousness: awake and alert and oriented Pain management: pain level controlled Vital Signs Assessment: post-procedure vital signs reviewed and stable Respiratory status: spontaneous breathing, nonlabored ventilation, respiratory function stable and patient connected to nasal cannula oxygen Cardiovascular status: blood pressure returned to baseline and stable Postop Assessment: no signs of nausea or vomiting Anesthetic complications: no       Last Vitals:  Vitals:   01/21/17 1330 01/21/17 1345  BP: 123/60 (!) 126/53  Pulse: 100 98  Resp: 11 11  Temp:      Last Pain:  Vitals:   01/21/17 1330  TempSrc:   PainSc: Asleep                 Laurie Penado A.

## 2017-01-21 NOTE — Op Note (Signed)
01/21/2017  12:22 PM  PATIENT:  Julie [name]  71 y.o. female  PRE-OPERATIVE DIAGNOSIS:  LUMBAR FORAMINAL STENOSIS with scoliosis, lateral spondylolisthesis, herniated lumbar disc, lumbago, radiculopathy L 34, L 45 levels  POST-OPERATIVE DIAGNOSIS:  LUMBAR FORAMINAL STENOSIS with scoliosis, lateral spondylolisthesis, herniated lumbar disc, lumbago, radiculopathy L 34, L 45 levels  PROCEDURE:  Procedure(s) with comments: RIGHT LUMBAR THREE-FOUR LUMBAR FOUR - FIVE ANTEROLATERAL LUMBAR INTERBODY FUSION WITH PERCUTANEOUS PEDICLE SCREWS (Right) - RIGHT L3-4 L4-5 ANTEROLATERAL LUMBAR INTERBODY FUSION WITH PERCUTANEOUS PEDICLE SCREWS LUMBAR PERCUTANEOUS PEDICLE SCREW 2 LEVEL (Right)  SURGEON:  Surgeon(s) and Role:    * Maeola Harman, MD - Primary    * Lisbeth Renshaw, MD - Assisting  PHYSICIAN ASSISTANT:   ASSISTANTS: Poteat, RN, Modena Jansky, PAS   ANESTHESIA:   general  EBL:  Total I/O In: 2000 [I.V.:2000] Out: 710 [Urine:650; Blood:60]  BLOOD ADMINISTERED:none  DRAINS: none   LOCAL MEDICATIONS USED:  MARCAINE    and LIDOCAINE   SPECIMEN:  No Specimen  DISPOSITION OF SPECIMEN:  N/A  COUNTS:  YES  TOURNIQUET:  * No tourniquets in log *  DICTATION: Patient is a 71 year old with severe spondylosis stenosis and scoliosis of the lumbar spine. It was elected to take her to surgery for anterolateral decompression and posterior pedicle screw fixation.  Procedure: Patient was brought to the operating room and placed in a right lateral decubitus position on the operative table and using orthogonally projected C-arm fluoroscopy the patient was placed so that the L3-4 and L4-5 levels were visualized in AP and lateral plane. The patient was then taped into position. The table was flexed so as to expose the L4-5 level as the patient has a high iliac crest. Skin was marked along with a posterior finger dissection incision. His flank was then prepped and draped in usual sterile fashion and  incisions were made sequentially at L4-5 L3-4 levels. Posterior finger dissection was made to enter the retroperitoneal space and then subsequently the probe was inserted into the psoas muscle from the right side initially at the L4-5 level. After mapping the neural elements were able to dock the probe per the midpoint of this vertebral level and without indications electrically of too close proximity to the neural tissues. Subsequently the self-retaining tractor was.after sequential dilators were utilized the shim was employed and the interspace was cleared of psoas muscle and then incised. A thorough discectomy was performed. Instruments were used to clear the interspace of disc material. After thorough discectomy was performed and this was performed using AP and lateral fluoroscopy a 10 lordotic by 55 x 18 mm implant was packed with BMP and Attrax bone graft extender. This was tamped into position using the slides and its position was confirmed on AP and lateral fluoroscopy. Subsequently exposure was performed at the L3-4 level and similar dissection was performed with locking of the self-retaining retractor. At this level were able to place a 10 lordotic 15 degree by 22 x 50 mm implant packed in a similar fashion. Hemostasis was assured the wounds were irrigated and closed with interrupted Vicryl sutures.  Sterile dressings were placed with Dermabond and occlusive dressings.  Patient was then turned into a prone position on the operating table on Harrah table and using AP and lateral fluoroscopy throughout this portion of the procedure, pedicle screws were placed using Nuvasive Reline cannulated percutaneous s of similar size. 100 mm rods were then affixed to the screw heads do a separate stab incision and locked down on  the screws. All connections were then torqued acrews. 2 screws were placed at L3, L 4, L 5 levels (6.5 x 40 mm) at each level.  65 mm rods were placed and locked down in situ.  Towers were  disassembled. The wounds were irrigated and then closed with 1, 2-0 and 3-0 Vicryl stitches. Sterile occlusive dressing was placed with Dermabond. The patient was then extubated in the operating room and taken to recovery in stable and satisfactory condition having tolerated his operation well. Counts were correct at the end of the case.   PLAN OF CARE: Admit to inpatient   PATIENT DISPOSITION:  PACU - hemodynamically stable.   Delay start of Pharmacological VTE agent (>24hrs) due to surgical blood loss or risk of bleeding: yes

## 2017-01-21 NOTE — Brief Op Note (Signed)
01/21/2017  12:22 PM  PATIENT:  Julie [name]  70 y.o. female  PRE-OPERATIVE DIAGNOSIS:  LUMBAR FORAMINAL STENOSIS with scoliosis, lateral spondylolisthesis, herniated lumbar disc, lumbago, radiculopathy L 34, L 45 levels  POST-OPERATIVE DIAGNOSIS:  LUMBAR FORAMINAL STENOSIS with scoliosis, lateral spondylolisthesis, herniated lumbar disc, lumbago, radiculopathy L 34, L 45 levels  PROCEDURE:  Procedure(s) with comments: RIGHT LUMBAR THREE-FOUR LUMBAR FOUR - FIVE ANTEROLATERAL LUMBAR INTERBODY FUSION WITH PERCUTANEOUS PEDICLE SCREWS (Right) - RIGHT L3-4 L4-5 ANTEROLATERAL LUMBAR INTERBODY FUSION WITH PERCUTANEOUS PEDICLE SCREWS LUMBAR PERCUTANEOUS PEDICLE SCREW 2 LEVEL (Right)  SURGEON:  Surgeon(s) and Role:    * Jerad Dunlap, MD - Primary    * Neelesh Nundkumar, MD - Assisting  PHYSICIAN ASSISTANT:   ASSISTANTS: Poteat, RN, Kohler, PAS   ANESTHESIA:   general  EBL:  Total I/O In: 2000 [I.V.:2000] Out: 710 [Urine:650; Blood:60]  BLOOD ADMINISTERED:none  DRAINS: none   LOCAL MEDICATIONS USED:  MARCAINE    and LIDOCAINE   SPECIMEN:  No Specimen  DISPOSITION OF SPECIMEN:  N/A  COUNTS:  YES  TOURNIQUET:  * No tourniquets in log *  DICTATION: Patient is a 70-year-old with severe spondylosis stenosis and scoliosis of the lumbar spine. It was elected to take her to surgery for anterolateral decompression and posterior pedicle screw fixation.  Procedure: Patient was brought to the operating room and placed in a right lateral decubitus position on the operative table and using orthogonally projected C-arm fluoroscopy the patient was placed so that the L3-4 and L4-5 levels were visualized in AP and lateral plane. The patient was then taped into position. The table was flexed so as to expose the L4-5 level as the patient has a high iliac crest. Skin was marked along with a posterior finger dissection incision. His flank was then prepped and draped in usual sterile fashion and  incisions were made sequentially at L4-5 L3-4 levels. Posterior finger dissection was made to enter the retroperitoneal space and then subsequently the probe was inserted into the psoas muscle from the right side initially at the L4-5 level. After mapping the neural elements were able to dock the probe per the midpoint of this vertebral level and without indications electrically of too close proximity to the neural tissues. Subsequently the self-retaining tractor was.after sequential dilators were utilized the shim was employed and the interspace was cleared of psoas muscle and then incised. A thorough discectomy was performed. Instruments were used to clear the interspace of disc material. After thorough discectomy was performed and this was performed using AP and lateral fluoroscopy a 10 lordotic by 55 x 18 mm implant was packed with BMP and Attrax bone graft extender. This was tamped into position using the slides and its position was confirmed on AP and lateral fluoroscopy. Subsequently exposure was performed at the L3-4 level and similar dissection was performed with locking of the self-retaining retractor. At this level were able to place a 10 lordotic 15 degree by 22 x 50 mm implant packed in a similar fashion. Hemostasis was assured the wounds were irrigated and closed with interrupted Vicryl sutures.  Sterile dressings were placed with Dermabond and occlusive dressings.  Patient was then turned into a prone position on the operating table on Jackson table and using AP and lateral fluoroscopy throughout this portion of the procedure, pedicle screws were placed using Nuvasive Reline cannulated percutaneous s of similar size. 100 mm rods were then affixed to the screw heads do a separate stab incision and locked down on   the screws. All connections were then torqued acrews. 2 screws were placed at L3, L 4, L 5 levels (6.5 x 40 mm) at each level.  65 mm rods were placed and locked down in situ.  Towers were  disassembled. The wounds were irrigated and then closed with 1, 2-0 and 3-0 Vicryl stitches. Sterile occlusive dressing was placed with Dermabond. The patient was then extubated in the operating room and taken to recovery in stable and satisfactory condition having tolerated his operation well. Counts were correct at the end of the case.   PLAN OF CARE: Admit to inpatient   PATIENT DISPOSITION:  PACU - hemodynamically stable.   Delay start of Pharmacological VTE agent (>24hrs) due to surgical blood loss or risk of bleeding: yes  

## 2017-01-21 NOTE — Progress Notes (Signed)
Report given to taylor rn as caregiver 

## 2017-01-21 NOTE — Anesthesia Procedure Notes (Signed)
Procedure Name: Intubation Date/Time: 01/21/2017 8:13 AM Performed by: Melina Copa, Tullio Chausse R Pre-anesthesia Checklist: Patient identified, Emergency Drugs available, Suction available and Patient being monitored Patient Re-evaluated:Patient Re-evaluated prior to inductionOxygen Delivery Method: Circle System Utilized Preoxygenation: Pre-oxygenation with 100% oxygen Intubation Type: IV induction Ventilation: Mask ventilation without difficulty Laryngoscope Size: Mac and 3 Grade View: Grade II Tube type: Oral Tube size: 7.5 mm Number of attempts: 1 Airway Equipment and Method: Stylet Placement Confirmation: ETT inserted through vocal cords under direct vision,  positive ETCO2 and breath sounds checked- equal and bilateral Secured at: 20 cm Tube secured with: Tape Dental Injury: Teeth and Oropharynx as per pre-operative assessment

## 2017-01-21 NOTE — Transfer of Care (Signed)
Immediate Anesthesia Transfer of Care Note  Patient: Julie [name]  Procedure(s) Performed: Procedure(s) with comments: RIGHT LUMBAR THREE-FOUR LUMBAR FOUR - FIVE ANTEROLATERAL LUMBAR INTERBODY FUSION WITH PERCUTANEOUS PEDICLE SCREWS (Right) - RIGHT L3-4 L4-5 ANTEROLATERAL LUMBAR INTERBODY FUSION WITH PERCUTANEOUS PEDICLE SCREWS LUMBAR PERCUTANEOUS PEDICLE SCREW 2 LEVEL (Right)  Patient Location: PACU  Anesthesia Type:General  Level of Consciousness: awake, oriented and patient cooperative  Airway & Oxygen Therapy: Patient Spontanous Breathing and Patient connected to nasal cannula oxygen  Post-op Assessment: Report given to RN, Post -op Vital signs reviewed and stable and Patient moving all extremities  Post vital signs: Reviewed and stable  Last Vitals:  Vitals:   01/21/17 0703  BP: (!) 145/65  Pulse: 63  Resp: 18  Temp: 36.6 C    Last Pain:  Vitals:   01/21/17 0703  TempSrc: Oral         Complications: No apparent anesthesia complications

## 2017-01-21 NOTE — Interval H&P Note (Signed)
History and Physical Interval Note:  01/21/2017 7:49 AM  Linard MillersPatricia M Guin  has presented today for surgery, with the diagnosis of LUMBAR FORAMINAL STENOSIS  The various methods of treatment have been discussed with the patient and family. After consideration of risks, benefits and other options for treatment, the patient has consented to  Procedure(s) with comments: RIGHT L3-4 L4-5 ANTEROLATERAL LUMBAR INTERBODY FUSION WITH PERCUTANEOUS PEDICLE SCREWS (Right) - RIGHT L3-4 L4-5 ANTEROLATERAL LUMBAR INTERBODY FUSION WITH PERCUTANEOUS PEDICLE SCREWS LUMBAR PERCUTANEOUS PEDICLE SCREW 2 LEVEL (Right) as a surgical intervention .  The patient's history has been reviewed, patient examined, no change in status, stable for surgery.  I have reviewed the patient's chart and labs.  Questions were answered to the patient's satisfaction.     Cashtyn Pouliot D

## 2017-01-22 ENCOUNTER — Encounter (HOSPITAL_COMMUNITY): Payer: Self-pay | Admitting: Neurosurgery

## 2017-01-22 MED ORDER — TAMSULOSIN HCL 0.4 MG PO CAPS
0.4000 mg | ORAL_CAPSULE | Freq: Every day | ORAL | Status: DC
  Administered 2017-01-22 – 2017-01-27 (×6): 0.4 mg via ORAL
  Filled 2017-01-22 (×6): qty 1

## 2017-01-22 NOTE — Evaluation (Signed)
Occupational Therapy Evaluation Patient Details Name: Julie [name] MRN: [id number] DOB: 1946-02-27 Today's Date: 01/22/2017    History of Present Illness Pt is a 71 y/o female, s/p L3, 4, 5 Fusion with screws.   Clinical Impression   Pt was educated in role of OT and handout was issued and reviewed for back precautions. Discussed homemaking and LB dressing, bathing techniques while adhering to back precautions. Pt spouse was educated in home set up and recommendations as well as pt. Pt participated in ADL retraining with focus on bed mobility, a/e for LB ADL's, toilet transfers and grooming while standing at sink. Pt will benefit from spouse PRN assist at d/c, they deny further acute OT needs at this time, however, [facility] in home setting following recent back surgery to assist with increased independence related to ADL's and self care.    Follow Up Recommendations  Home health OT;Supervision - Intermittent    Equipment Recommendations  Other (comment) (Pt may benefit from long handled a/e. )    Recommendations for Other Services       Precautions / Restrictions Precautions Precautions: Back;Fall Precaution Booklet Issued: Yes (comment) Precaution Comments: Back precaution handout issued. Brace in sitting Required Braces or Orthoses: Spinal Brace Spinal Brace: Applied in sitting position Restrictions Weight Bearing Restrictions: No      Mobility Bed Mobility Overal bed mobility: Needs Assistance Bed Mobility: Rolling;Sit to Sidelying;Sidelying to Sit Rolling: Min assist Sidelying to sit: Min assist     Sit to sidelying: Min assist General bed mobility comments: Min VC's & TC's for adherence to back precautions, especially during sit to sidelying technique. Handout reviewed after pt performed and stressed importance to pt/husband.  Transfers Overall transfer level: Needs assistance Equipment used: Rolling walker (2  wheeled) Transfers: Sit to/from [employer] CorporationStand;Stand Pivot Transfers Sit to Stand: Min assist Stand pivot transfers: Min assist            Balance                                            ADL Overall ADL's : Needs assistance/impaired     Grooming: Wash/dry hands;Wash/dry face;Oral care;Supervision/safety;Standing   Upper Body Bathing: Set up;Sitting;Supervision/ safety Upper Body Bathing Details (indicate cue type and reason): VC's for adhering to back precautions Lower Body Bathing: Set up;Minimal assistance;Sit to/from stand;With caregiver independent assisting   Upper Body Dressing : Set up;With caregiver independent assisting;Min guard;Sitting   Lower Body Dressing: Set up;With caregiver independent assisting;Minimal assistance;Sit to/from stand   Toilet Transfer: Ambulation;RW;Cueing for sequencing;Min guard;Regular Toilet;Grab bars   Toileting- ArchitectClothing Manipulation and Hygiene: Min guard;Sit to/from stand;Adhering to back precautions   Tub/ Shower Transfer: Walk-in shower;Ambulation;Cueing for sequencing;Minimal assistance;Rolling walker;Adhering to back precautions (Simulated shopwer transfer, pt taking big steps in room and stepping over threshold in bathroom and side stepping into bathroom) Tub/Shower Transfer Details (indicate cue type and reason): Pt spouse reports that he is able to assist PRN for all bathing and dressing. Functional mobility during ADLs: Min guard;Rolling walker;Cueing for sequencing;Caregiver able to provide necessary level of assistance General ADL Comments: Pt was educated in role of OT and handout was issued and reviewed for back precautions. Discussed homemaking and LB dressing, bathing techniques while adhering to back precautions. Pt spouse was educated in home set up and recommendations as well as pt. Pt participated  in ADL retraining with focus on bed mobility, a/e for LB ADL's, toilet transfers and grooming while standing at sink.  Pt will benefit from spouse PRN assist at d/c, they deny further acute OT needs at this time.     Vision  Wears glasses at all times. No change from baseline.   Perception     Praxis      Pertinent Vitals/Pain Pain Assessment: 0-10 Pain Score: 6  Pain Location: Low back Pain Descriptors / Indicators: Aching;Sore Pain Intervention(s): Limited activity within patient's tolerance;Monitored during session;Premedicated before session;Repositioned     Hand Dominance Right   Extremity/Trunk Assessment Upper Extremity Assessment Upper Extremity Assessment: Overall WFL for tasks assessed   Lower Extremity Assessment Lower Extremity Assessment: Defer to PT evaluation       Communication Communication Communication: No difficulties   Cognition Arousal/Alertness: Awake/alert Behavior During Therapy: WFL for tasks assessed/performed Overall Cognitive Status: Within Functional Limits for tasks assessed                     General Comments       Exercises       Shoulder Instructions      Home Living Family/patient expects to be discharged to:: Private residence Living Arrangements: Spouse/significant other Available Help at Discharge: Family;Available 24 hours/day Type of Home: House Home Access: Stairs to enter [employer] of Steps: 1   Home Layout: One level     Bathroom Shower/Tub: Producer, television/film/video: Standard Bathroom Accessibility: Yes   Home Equipment: None          Prior Functioning/Environment Level of Independence: Independent with assistive device(s)        Comments: Was wearing back brace prior to surgery "For about a month now" Pt spouse reportst hat he has been assisting PRN for ADL's secondary to back pain.        OT Problem List: Decreased knowledge of precautions;Pain;Decreased activity tolerance;Decreased knowledge of use of DME or AE   OT Treatment/Interventions:      OT Goals(Current goals can be  found in the care plan section) Acute Rehab OT Goals Patient Stated Goal: Go home with husband assist PRN for ADL's and self care  OT Frequency:     Barriers to D/C:            Co-evaluation              End of Session Equipment Utilized During Treatment: Rolling walker;Back brace Nurse Communication: Other (comment) (recommend HHOT for Eval/assessment and recommendations)  Activity Tolerance: Patient tolerated treatment well Patient left: in bed;with call bell/phone within reach;with family/visitor present   Time: 8119-1478 OT Time Calculation (min): 31 min Charges:  OT General Charges $OT Visit: 1 Procedure OT Evaluation $OT Eval Low Complexity: 1 Procedure OT Treatments $Self Care/Home Management : 8-22 mins G-Codes:    Roselie Awkward Dixon, OTR/L 01/22/2017, 8:52 AM

## 2017-01-22 NOTE — Evaluation (Signed)
Physical Therapy Evaluation Patient Details Name: Julie [name] MRN: [id number] DOB: [birth year] Today's Date: 01/22/2017   History of Present Illness  Pt is a 71 y/o female, s/p L3, 4, 5 Fusion with screws.  Clinical Impression  Pt admitted with above diagnosis. Pt currently with functional limitations due to the deficits listed below (see PT Problem List). At the time of PT eval pt was able to perform transfers and ambulation with min assist for balance support, safety, and maintenance of back precautions. Pt will benefit from skilled PT to increase their independence and safety with mobility to allow discharge to the venue listed below.       Follow Up Recommendations Home health PT    Equipment Recommendations  Rolling walker with 5" wheels    Recommendations for Other Services       Precautions / Restrictions Precautions Precautions: Back;Fall Precaution Booklet Issued: Yes (comment) Precaution Comments: Back precaution handout issued. Brace in sitting Required Braces or Orthoses: Spinal Brace Spinal Brace: Applied in sitting position Restrictions Weight Bearing Restrictions: No      Mobility  Bed Mobility Overal bed mobility: Needs Assistance Bed Mobility: Rolling;Sit to Sidelying;Sidelying to Sit Rolling: Min assist Sidelying to sit: Min assist     Sit to sidelying: Min assist General bed mobility comments: VC's and hands-on assist for proper sequencing and technique. Pt and husband were educated on proper log roll technique. Bed height was elevated to simulate home environment.   Transfers Overall transfer level: Needs assistance Equipment used: Rolling walker (2 wheeled) Transfers: Sit to/from [employer] CorporationStand;Stand Pivot Transfers Sit to Stand: Min assist Stand pivot transfers: Min assist       General transfer comment: Steadying assist as pt powered-up to full standing position.   Ambulation/Gait Ambulation/Gait assistance: Min guard Ambulation Distance (Feet):  100 Feet Assistive device: Rolling walker (2 wheeled) Gait Pattern/deviations: Step-through pattern;Decreased stride length;Trunk flexed Gait velocity: Decreased Gait velocity interpretation: Below normal speed for age/gender General Gait Details: Pt was cued for improved posture and safety with the RW. Was able to make corrective changes with cues for increased floor clearance.   Stairs            Wheelchair Mobility    Modified Rankin (Stroke Patients Only)       Balance Overall balance assessment: Needs assistance Sitting-balance support: Feet supported;No upper extremity supported Sitting balance-Leahy Scale: Fair     Standing balance support: Bilateral upper extremity supported;During functional activity Standing balance-Leahy Scale: Poor                               Pertinent Vitals/Pain Pain Assessment: 0-10 Pain Score: 6  Pain Location: Low back Pain Descriptors / Indicators: Aching;Sore Pain Intervention(s): Monitored during session    Home Living Family/patient expects to be discharged to:: Private residence Living Arrangements: Spouse/significant other Available Help at Discharge: Family;Available 24 hours/day Type of Home: House Home Access: Stairs to enter   [employer] CorporationEntrance Stairs-Number of Steps: 1 Home Layout: One level Home Equipment: None      Prior Function Level of Independence: Independent with assistive device(s)         Comments: Was wearing back brace prior to surgery "For about a month now" Pt spouse reports that he has been assisting PRN for ADL's secondary to back pain.     Hand Dominance   Dominant Hand: Right    Extremity/Trunk Assessment   Upper Extremity Assessment Upper Extremity Assessment: Defer  to OT evaluation    Lower Extremity Assessment Lower Extremity Assessment: Overall WFL for tasks assessed    Cervical / Trunk Assessment Cervical / Trunk Assessment: Other exceptions Cervical / Trunk Exceptions:  s/p surgery  Communication   Communication: No difficulties  Cognition Arousal/Alertness: Awake/alert Behavior During Therapy: WFL for tasks assessed/performed Overall Cognitive Status: Within Functional Limits for tasks assessed                      General Comments      Exercises     Assessment/Plan    PT Assessment Patient needs continued PT services  PT Problem List Decreased strength;Decreased range of motion;Decreased activity tolerance;Decreased balance;Decreased mobility;Decreased knowledge of use of DME;Decreased safety awareness;Decreased knowledge of precautions;Pain          PT Treatment Interventions DME instruction;Gait training;Stair training;Therapeutic activities;Functional mobility training;Neuromuscular re-education;Cognitive remediation;Patient/family education    PT Goals (Current goals can be found in the Care Plan section)  Acute Rehab PT Goals Patient Stated Goal: Go home with husband assist PRN for ADL's and self care PT Goal Formulation: With patient/family Time For Goal Achievement: 01/29/17 Potential to Achieve Goals: Good    Frequency Min 5X/week   Barriers to discharge        Co-evaluation               End of Session Equipment Utilized During Treatment: Back brace Activity Tolerance: Patient tolerated treatment well Patient left: in chair;with call bell/phone within reach;with family/visitor present Nurse Communication: Mobility status         Time: 5409-8119 PT Time Calculation (min) (ACUTE ONLY): 29 min   Charges:   PT Evaluation $PT Eval Moderate Complexity: 1 Procedure PT Treatments $Gait Training: 8-22 mins   PT G Codes:        Marylynn Pearson 01/31/2017, 9:27 AM   Conni Slipper, PT, DPT Acute Rehabilitation Services Pager: [phone]

## 2017-01-22 NOTE — Progress Notes (Signed)
Subjective: Patient reports doing well  Objective: Vital signs in last 24 hours: Temp:  [97.3 F (36.3 C)-98.2 F (36.8 C)] 98.2 F (36.8 C) (01/24 0430) Pulse Rate:  [74-113] 82 (01/24 0430) Resp:  [11-21] 18 (01/24 0430) BP: (107-135)/(53-87) 110/59 (01/24 0430) SpO2:  [94 %-98 %] 96 % (01/24 0430)  Intake/Output from previous day: 01/23 0701 - 01/24 0700 In: 2540 [P.O.:240; I.V.:2300] Out: 1960 [Urine:1900; Blood:60] Intake/Output this shift: No intake/output data recorded.  Physical Exam: Leg pain resolved with full strength in both legs.  Dressings CDI.  Lab Results: No results for input(s): WBC, HGB, HCT, PLT in the last 72 hours. BMET No results for input(s): NA, K, CL, CO2, GLUCOSE, BUN, CREATININE, CALCIUM in the last 72 hours.  Studies/Results: Dg Lumbar Spine 2-3 Views  Result Date: 01/21/2017 CLINICAL DATA:  L3-4, L4-5 fusion EXAM: DG C-ARM GT 120 MIN; LUMBAR SPINE - 2-3 VIEW COMPARISON:  None. FINDINGS: Two intraoperative spot images demonstrate changes of posterior fusion at L3-4 L4-5. Normal alignment. No hardware complicating feature. IMPRESSION: Posterior fusion.  No visible complicating feature. Electronically Signed   By: Charlett NoseKevin  Dover M.D.   On: 01/21/2017 12:40   Dg C-arm Gt 120 Min  Result Date: 01/21/2017 CLINICAL DATA:  L3-4, L4-5 fusion EXAM: DG C-ARM GT 120 MIN; LUMBAR SPINE - 2-3 VIEW COMPARISON:  None. FINDINGS: Two intraoperative spot images demonstrate changes of posterior fusion at L3-4 L4-5. Normal alignment. No hardware complicating feature. IMPRESSION: Posterior fusion.  No visible complicating feature. Electronically Signed   By: Charlett NoseKevin  Dover M.D.   On: 01/21/2017 12:40    Assessment/Plan: Doing well.  Mobilize today.      LOS: 1 day    Dorian HeckleSTERN,Orlo Brickle D, MD 01/22/2017, 7:55 AM

## 2017-01-23 ENCOUNTER — Inpatient Hospital Stay (HOSPITAL_COMMUNITY): Payer: Medicare Other

## 2017-01-23 DIAGNOSIS — J9601 Acute respiratory failure with hypoxia: Secondary | ICD-10-CM

## 2017-01-23 DIAGNOSIS — J9602 Acute respiratory failure with hypercapnia: Secondary | ICD-10-CM

## 2017-01-23 LAB — CBC WITH DIFFERENTIAL/PLATELET
Basophils Absolute: 0 10*3/uL (ref 0.0–0.1)
Basophils Relative: 0 %
Eosinophils Absolute: 0 10*3/uL (ref 0.0–0.7)
Eosinophils Relative: 0 %
HCT: 29.4 % — ABNORMAL LOW (ref 36.0–46.0)
Hemoglobin: 9.5 g/dL — ABNORMAL LOW (ref 12.0–15.0)
Lymphocytes Relative: 12 %
Lymphs Abs: 0.8 10*3/uL (ref 0.7–4.0)
MCH: 30.2 pg (ref 26.0–34.0)
MCHC: 32.3 g/dL (ref 30.0–36.0)
MCV: 93.3 fL (ref 78.0–100.0)
Monocytes Absolute: 0.8 10*3/uL (ref 0.1–1.0)
Monocytes Relative: 12 %
Neutro Abs: 5 10*3/uL (ref 1.7–7.7)
Neutrophils Relative %: 76 %
Platelets: 190 10*3/uL (ref 150–400)
RBC: 3.15 MIL/uL — ABNORMAL LOW (ref 3.87–5.11)
RDW: 13.3 % (ref 11.5–15.5)
WBC: 6.5 10*3/uL (ref 4.0–10.5)

## 2017-01-23 LAB — BASIC METABOLIC PANEL
Anion gap: 9 (ref 5–15)
BUN: 16 mg/dL (ref 6–20)
CO2: 23 mmol/L (ref 22–32)
Calcium: 8.5 mg/dL — ABNORMAL LOW (ref 8.9–10.3)
Chloride: 101 mmol/L (ref 101–111)
Creatinine, Ser: 0.98 mg/dL (ref 0.44–1.00)
GFR calc Af Amer: >60 mL/min (ref >60)
GFR calc non Af Amer: 57 mL/min — ABNORMAL LOW (ref >60)
Glucose, Bld: 152 mg/dL — ABNORMAL HIGH (ref 65–99)
Potassium: 4.9 mmol/L (ref 3.5–5.1)
Sodium: 133 mmol/L — ABNORMAL LOW (ref 135–145)

## 2017-01-23 LAB — BLOOD GAS, ARTERIAL
Acid-Base Excess: 4.4 mmol/L — ABNORMAL HIGH (ref 0.0–2.0)
Acid-Base Excess: 6.3 mmol/L — ABNORMAL HIGH (ref 0.0–2.0)
Bicarbonate: 29.9 mmol/L — ABNORMAL HIGH (ref 20.0–28.0)
Bicarbonate: 31.1 mmol/L — ABNORMAL HIGH (ref 20.0–28.0)
Delivery systems: POSITIVE
Drawn by: 10552
Drawn by: 270271
Expiratory PAP: 5
FIO2: 32
FIO2: 40
Inspiratory PAP: 14
O2 Saturation: 93.8 %
O2 Saturation: 96.5 %
Patient temperature: 98.6
Patient temperature: 98.6
pCO2 arterial: 58 mmHg — ABNORMAL HIGH (ref 32.0–48.0)
pCO2 arterial: 52.6 mmHg — ABNORMAL HIGH (ref 32.0–48.0)
pH, Arterial: 7.333 — ABNORMAL LOW (ref 7.350–7.450)
pH, Arterial: 7.39 (ref 7.350–7.450)
pO2, Arterial: 69.3 mmHg — ABNORMAL LOW (ref 83.0–108.0)
pO2, Arterial: 78.8 mmHg — ABNORMAL LOW (ref 83.0–108.0)

## 2017-01-23 MED ORDER — ALPRAZOLAM 0.25 MG PO TABS
0.2500 mg | ORAL_TABLET | Freq: Two times a day (BID) | ORAL | Status: DC
  Administered 2017-01-23: 0.25 mg via ORAL
  Filled 2017-01-23: qty 1

## 2017-01-23 MED ORDER — GUAIFENESIN ER 600 MG PO TB12
600.0000 mg | ORAL_TABLET | Freq: Two times a day (BID) | ORAL | Status: DC
  Administered 2017-01-24 – 2017-01-27 (×8): 600 mg via ORAL
  Filled 2017-01-23 (×10): qty 1

## 2017-01-23 MED ORDER — FUROSEMIDE 10 MG/ML IJ SOLN
40.0000 mg | Freq: Once | INTRAMUSCULAR | Status: AC
  Administered 2017-01-23: 40 mg via INTRAVENOUS
  Filled 2017-01-23 (×2): qty 4

## 2017-01-23 MED ORDER — ALPRAZOLAM 0.25 MG PO TABS
0.2500 mg | ORAL_TABLET | Freq: Two times a day (BID) | ORAL | Status: DC | PRN
  Administered 2017-01-25: 0.25 mg via ORAL
  Filled 2017-01-23 (×2): qty 1

## 2017-01-23 MED ORDER — TRAZODONE HCL 100 MG PO TABS
100.0000 mg | ORAL_TABLET | Freq: Every evening | ORAL | Status: DC | PRN
  Administered 2017-01-25: 100 mg via ORAL
  Filled 2017-01-23 (×2): qty 1

## 2017-01-23 MED ORDER — HYDROCODONE-ACETAMINOPHEN 10-325 MG PO TABS
1.0000 | ORAL_TABLET | ORAL | Status: DC | PRN
  Administered 2017-01-23 – 2017-01-28 (×12): 1 via ORAL
  Filled 2017-01-23 (×12): qty 1

## 2017-01-23 NOTE — Progress Notes (Addendum)
Initially saw patient this am about 9:00am.  She was lethargic but easily arousable.  O2 sats 91-92% on 3l [place]. Plan transfer to SDU for aggressive pulmonary toilet.  IS 750 and able to use flutter valve.  Pain medications were minimized.  She has been using IS and flutter valve every hour during day.  Per RN she has been up to the chair but is much weaker than yesterday.  Congested cough, Lung sounds scattered rhonchi, clears with cough.  40mg  lasix given with good response. This afternoon her O2 sats are 96% on 3l but she remains weak and somewhat lethargic.  Recommended ABG.  Result 7.33/58/69  CCM notified of results.  Plan transfer to SDU for Bipap.  Husband at bedside through out day. Patient transferred to [phone]

## 2017-01-23 NOTE — Progress Notes (Signed)
BiPAP initiated per MD's orders patient tolerated well.

## 2017-01-23 NOTE — Consult Note (Signed)
PULMONARY / CRITICAL CARE MEDICINE   Name: Julie [name] MRN: [id number] DOB: 1946-01-09    ADMISSION DATE:  01/21/2017 CONSULTATION DATE:  1/25  REFERRING MD:  Dr. Venetia Maxon  CHIEF COMPLAINT:  Resp distress  HISTORY OF PRESENT ILLNESS:   72 yo female , never smoker, who had lumbar laminectomy 1/23, 1/25 was noted to be lethargic, shallow respirations, confusion. Evaluated by NS and PCCM was consulted for moderated resp distress. She dis respond to aggressive stimulation. Note she had received narcotics and muscle relaxants @0400 . We will tx to SDU, institute aggressive pulmonary toilet and check blood cultures for completeness along with cbc.  PAST MEDICAL HISTORY :  She  has a past medical history of Depression; GERD (gastroesophageal reflux disease); Headache; and Hypercholesteremia.  PAST SURGICAL HISTORY: She  has a past surgical history that includes Abdominal hysterectomy; Bladder suspension (2013); Nasal septum surgery (2013); ERCP (N/A, 04/08/2014); sphincterotomy (N/A, 04/08/2014); Balloon dilation (N/A, 04/08/2014); pancreatic stent placement (N/A, 04/08/2014); Cholecystectomy; Anterior lat lumbar fusion (Right, 01/21/2017); and Lumbar percutaneous pedicle screw 2 level (Right, 01/21/2017).  Allergies  Allergen Reactions  . No Known Allergies     No current facility-administered medications on file prior to encounter.    Current Outpatient Prescriptions on File Prior to Encounter  Medication Sig  . ALPRAZolam (XANAX) 0.5 MG tablet Take 0.5 mg by mouth 2 (two) times daily.   Marland Kitchen atorvastatin (LIPITOR) 20 MG tablet Take 20 mg by mouth at bedtime.   Marland Kitchen estradiol (ESTRACE) 0.5 MG tablet Take 0.5 mg by mouth every other day.  . pantoprazole (PROTONIX) 40 MG tablet Take 40 mg by mouth daily.  . traZODone (DESYREL) 100 MG tablet Take 100 mg by mouth 2 (two) times daily.    FAMILY HISTORY:  Her has no family status information on file.    SOCIAL HISTORY: She  reports that she has  never smoked. She has never used smokeless tobacco. She reports that she does not drink alcohol or use drugs.  REVIEW OF SYSTEMS:   10 point review of system taken, please see HPI for positives and negatives. Limited due to lethargy.   SUBJECTIVE:  Lethargic  VITAL SIGNS: BP (!) 98/53 (BP Location: Left Arm) Comment: taken by Rapid Respone RN   Pulse 84   Temp 99.8 F (37.7 C) (Oral)   Resp 18   Ht 5\' 1"  (1.549 m)   Wt 141 lb 2 oz (64 kg)   SpO2 91%   BMI 26.67 kg/m   HEMODYNAMICS:    VENTILATOR SETTINGS:    INTAKE / OUTPUT: I/O last 3 completed shifts: In: -  Out: 2800 [Urine:2800]  PHYSICAL EXAMINATION: General: Lethargic but arousable Neuro:  AS above, more awake with stimulation HEENT:No JVD/LAN Cardiovascular: HSR RRR Lungs: Shallow respirations , congested cough Abdomen:  Soft +bs Musculoskeletal:  intatat Skin: Lumbar dressing intact  LABS:  BMET No results for input(s): NA, K, CL, CO2, BUN, CREATININE, GLUCOSE in the last 168 hours.  Electrolytes No results for input(s): CALCIUM, MG, PHOS in the last 168 hours.  CBC No results for input(s): WBC, HGB, HCT, PLT in the last 168 hours.  Coag's No results for input(s): APTT, INR in the last 168 hours.  Sepsis Markers No results for input(s): LATICACIDVEN, PROCALCITON, O2SATVEN in the last 168 hours.  ABG No results for input(s): PHART, PCO2ART, PO2ART in the last 168 hours.  Liver Enzymes No results for input(s): AST, ALT, ALKPHOS, BILITOT, ALBUMIN in the last 168 hours.  Cardiac Enzymes  No results for input(s): TROPONINI, PROBNP in the last 168 hours.  Glucose No results for input(s): GLUCAP in the last 168 hours.  Imaging Dg Chest Port 1 View  Result Date: 01/23/2017 CLINICAL DATA:  Chest congestion, shortness of Breath EXAM: PORTABLE CHEST 1 VIEW COMPARISON:  None. FINDINGS: Heart is borderline in size. Perihilar and lower lobe opacities are noted which could reflect mild edema, less  likely infection. No effusions or acute bony abnormality. IMPRESSION: Borderline heart size with patchy perihilar and lower lobe opacities, favor edema/ CHF. Electronically Signed   By: Charlett NoseKevin  Dover M.D.   On: 01/23/2017 08:23     STUDIES:  1/25 c x r rll opacity  CULTURES: 1/25 bc x 2>>  ANTIBIOTICS: None at this time  SIGNIFICANT EVENTS: 1/23 lumber laminectomy 1/25 tx to SDU for pulmonary toilet  LINES/TUBES:   DISCUSSION: 71 yo female , never smoker, who had lumbar laminectomy 1/23, 1/25 was noted to be lethargic, shallow respirations, confusion. Evaluated by NS and PCCM was consulted for moderated resp distress. She dis respond to aggressive stimulation. Note she had received narcotics and muscle relaxants @0400 . We will tx to SDU, institute aggressive pulmonary toilet and check blood cultures for completeness along with cbc.  ASSESSMENT / PLAN:  PULMONARY A: Poor pulmonary effort secondary to sedation, sugery. P:   IS, Flutter, CxR  Move to SDU  CARDIOVASCULAR A:  HD stable P:  Monitor VS  RENAL A:   No acute issue P:     GASTROINTESTINAL A:   Hx of GERD P:   PPI  HEMATOLOGIC A:   No acute issue  P:    INFECTIOUS A:   NO overt infection, cannot ro out rll process but suspect atx/edema P:   Pulmonary toilet Repeat cbc for white count Follow CxR    NEUROLOGIC A:   Lethargic 2 days post op lumbar laminectomy. Multifactorial with pain meds P:   RASS goal: 0 Decreased pain meds ans try non narcotic analgesics   FAMILY  - Updates:   - Inter-disciplinary family meet or Palliative Care meeting due by:  day 7   Steve Janeva Peaster ACNP [facility] PCCM Pager [phone] till 3 pm If no answer page [phone] 01/23/2017, 9:07 AM

## 2017-01-23 NOTE — Progress Notes (Signed)
Pt's lungs are very congested and she seems different from yesterday. Pt is arousable but is sleepy. Pt states she does not feel good. Pt's O2 sat was in the 80's on room air. Pt was placed on 3L with O2 sat at 94%. Pt's oral temp is 99.8. Pt and husband instructed on using the IS at bedside. Dr. Venetia MaxonStern notified and orders were given. Will continue to monitor Pt. Rema FendtAshley Zenola Dezarn, RN

## 2017-01-23 NOTE — Progress Notes (Signed)
Physical Therapy Treatment Patient Details Name: Julie [name] MRN: [id number] DOB: 22-Feb-1946 Today's Date: 01/23/2017    History of Present Illness Pt is a 71 y/o female, s/p L3, 4, 5 Fusion with screws.    PT Comments    Pt with increased pain/lethargy/work of breathing this session. Pt on supplemental O2 throughout session and awaiting transfer to step-down. PT was able to assist pt to chair for breakfast. Tolerance for functional activity is very low at this time and pt is requiring increased assist for basic transfers. If functional improvement is not seen next session, may want to consider a higher level of care at d/c as husband is not able to physically manage pt at this time.   Follow Up Recommendations  Home health PT;Supervision/Assistance - 24 hour     Equipment Recommendations  Rolling walker with 5" wheels    Recommendations for Other Services       Precautions / Restrictions Precautions Precautions: Back;Fall Precaution Booklet Issued: Yes (comment) Precaution Comments: Reviewed precautions throughout functional mobility.  Required Braces or Orthoses: Spinal Brace Spinal Brace: Applied in sitting position Restrictions Weight Bearing Restrictions: No    Mobility  Bed Mobility Overal bed mobility: Needs Assistance Bed Mobility: Rolling;Sidelying to Sit Rolling: Max assist Sidelying to sit: Max assist       General bed mobility comments: Hand-over-hand assist to reach for bed rails. Pt unable to maintain grasp. Pt required assist for every aspect of bed mobility, and bed pad used to assist in rolling and to EOB.  Transfers Overall transfer level: Needs assistance Equipment used: 2 person hand held assist Transfers: Stand Pivot Transfers   Stand pivot transfers: Mod assist;+2 physical assistance       General transfer comment: Assist to power-up to full standing position. Pt very weak and unable to transition to chair without +2 assistance.    Ambulation/Gait             General Gait Details: Unable to tolerate this session.    Stairs            Wheelchair Mobility    Modified Rankin (Stroke Patients Only)       Balance Overall balance assessment: Needs assistance Sitting-balance support: Feet supported;Bilateral upper extremity supported Sitting balance-Leahy Scale: Poor Sitting balance - Comments: Required assist to maintain upright posture this session Postural control: Posterior lean Standing balance support: Bilateral upper extremity supported;During functional activity Standing balance-Leahy Scale: Zero Standing balance comment: +2 required                    Cognition Arousal/Alertness: Awake/alert Behavior During Therapy: WFL for tasks assessed/performed Overall Cognitive Status: Within Functional Limits for tasks assessed                      Exercises      General Comments        Pertinent Vitals/Pain Pain Assessment: Faces Faces Pain Scale: Hurts little more Pain Location: Low back Pain Descriptors / Indicators: Aching;Sore Pain Intervention(s): Limited activity within patient's tolerance;Monitored during session;Repositioned    Home Living                      Prior Function            PT Goals (current goals can now be found in the care plan section) Acute Rehab PT Goals Patient Stated Goal: Go home with husband assist PRN for ADL's and self care PT Goal Formulation:  With patient/family Time For Goal Achievement: 01/29/17 Potential to Achieve Goals: Good Progress towards PT goals: Not progressing toward goals - comment    Frequency    Min 5X/week      PT Plan Current plan remains appropriate    Co-evaluation             End of Session Equipment Utilized During Treatment: Oxygen Activity Tolerance: Patient limited by fatigue Patient left: in chair;with call bell/phone within reach;with family/visitor present     Time:  1610-9604 PT Time Calculation (min) (ACUTE ONLY): 16 min  Charges:  $Gait Training: 8-22 mins                    G Codes:      Marylynn Pearson 01-27-17, 11:51 AM   Conni Slipper, PT, DPT Acute Rehabilitation Services Pager: [phone]

## 2017-01-23 NOTE — Progress Notes (Signed)
Patient ID: Julie [name], female   DOB: June 04, 1946, 71 y.o.   MRN: [id number] Pulmonary/Critical Care consult request placed.

## 2017-01-23 NOTE — Progress Notes (Signed)
Done a ABG on pt per Bipap protocol.. ABG values qualified patient to come off bipap patient has congestion she is trying to cough up.  Placed her on 3 lpm [place] pt sp02 and wob is good will continue to monitor.  taught flutter to patient again and encouraged her importance of the exercise to induce removal of sputum.  She is to perform exercise ever hour while awake.

## 2017-01-23 NOTE — Care Management Note (Signed)
Case Management Note  Patient Details  Name: Julie [name] MRN: [id number] Date of Birth: Oct 04, 1946  Subjective/Objective:    71 yr old female s/p L3,4,5 anterolateral lumbar interbody fusion  with percutaneous screws.                  Action/Plan: CM did not speak with patient/family, she is experiencing respiratory difficulties and waiting transfer to stepdown. CM will follow.   Expected Discharge Date:    pending              Expected Discharge Plan:  Home w Home Health Services  In-House Referral:     Discharge planning Services  CM Consult  Post Acute Care Choice:  Durable Medical Equipment, Home Health Choice offered to:     DME Arranged:    DME Agency:     HH Arranged:    HH Agency:     Status of Service:  In process, will continue to follow  If discussed at Long Length of Stay Meetings, dates discussed:    Additional Comments:  Durenda GuthrieBrady, Chen Saadeh Naomi, RN 01/23/2017, 3:11 PM

## 2017-01-23 NOTE — Progress Notes (Signed)
Pt arrived via bed from unit 3C, into room 3W16 stepdown. Pt transferred over to unit bed, placed on BiPAP by RR-RN. RT notified pt here on unit and need assistance to assess BiPAP placement. Pt's husband at bedside. Pt not in acute distress on arrival.

## 2017-01-23 NOTE — Progress Notes (Addendum)
Subjective: Patient reports "I don't feel too good"  Objective: Vital signs in last 24 hours: Temp:  [98.1 F (36.7 C)-99.4 F (37.4 C)] 98.3 F (36.8 C) (01/25 0418) Pulse Rate:  [79-92] 90 (01/25 0418) Resp:  [16-18] 16 (01/25 0418) BP: (105-125)/(47-57) 125/57 (01/25 0418) SpO2:  [93 %-99 %] 94 % (01/25 0418)  Intake/Output from previous day: 01/24 0701 - 01/25 0700 In: -  Out: 1800 [Urine:1800] Intake/Output this shift: No intake/output data recorded.  Awake, responding to questions, but offering no conversation. Husband present advises she is unable to turn herself in bed this morning. She moves all extremities to command, but is quite weak. Dyspneic with conversation, on oxygen at 3lpm. Foley in place. Incisions without erythema, swelling, or drainage beneath honeycomb & Dermabond. Some bruising at right side incisions, not unexpected. No bm yet, but no distention.   Lab Results: No results for input(s): WBC, HGB, HCT, PLT in the last 72 hours. BMET No results for input(s): NA, K, CL, CO2, GLUCOSE, BUN, CREATININE, CALCIUM in the last 72 hours.  Studies/Results: Dg Lumbar Spine 2-3 Views  Result Date: 01/21/2017 CLINICAL DATA:  L3-4, L4-5 fusion EXAM: DG C-ARM GT 120 MIN; LUMBAR SPINE - 2-3 VIEW COMPARISON:  None. FINDINGS: Two intraoperative spot images demonstrate changes of posterior fusion at L3-4 L4-5. Normal alignment. No hardware complicating feature. IMPRESSION: Posterior fusion.  No visible complicating feature. Electronically Signed   By: Charlett NoseKevin  Dover M.D.   On: 01/21/2017 12:40   Dg C-arm Gt 120 Min  Result Date: 01/21/2017 CLINICAL DATA:  L3-4, L4-5 fusion EXAM: DG C-ARM GT 120 MIN; LUMBAR SPINE - 2-3 VIEW COMPARISON:  None. FINDINGS: Two intraoperative spot images demonstrate changes of posterior fusion at L3-4 L4-5. Normal alignment. No hardware complicating feature. IMPRESSION: Posterior fusion.  No visible complicating feature. Electronically Signed   By:  Charlett NoseKevin  Dover M.D.   On: 01/21/2017 12:40    Assessment/Plan:   LOS: 2 days  Per DrStern, portable CXR stat, BMP, CBC. Continue supplemental O2 via [place]. Mobilize as tolerated.    Georgiann Cockeroteat, Brian 01/23/2017, 7:20 AM  Pulmonary Critical Care consulted to see patient and assist with respiratory issues.

## 2017-01-23 NOTE — Progress Notes (Signed)
Order placed for SDU and bed request was placed. RR nurse came to see the Pt because there are not any SDU beds at this time. We will continue to monitor Pt on the floor and will call if status changes. Pt placement will place in SDU bed when available. I will continue to monitor Pt closely. S. Minor, NP notified of the bed situation. Rema FendtAshley Caileb Rhue, RN

## 2017-01-23 NOTE — Progress Notes (Addendum)
Report called to Tammy, RN on 3W. Pt transferred to 3W16 to be placed on Bipap per MD order. RT notified of Pt's transfer. Husband is at the bedside with the Pt. RR nurse and NT transferred Pt @ 1820. Rema FendtAshley Jeslie Lowe, RN

## 2017-01-23 NOTE — Progress Notes (Addendum)
Progress Note  Physical Exam General: Adult female, lying in bed, respiratory distress  Neuro: Lethargic, oriented, follows commands  HEENT: normocephalic  Cardio: No MRG, NI S1/S2, RRR Lungs: Labored/Swallow breathing, scattered rhonchi  ABD: non-tender, non-distended, active bowel sounds  Musculoskeletal: no deformities  Skin: warm, dry, intact  Called to Patient bedside, This morning patient required transfer to Step-down unit secondary to acute respiratory failure however, no beds were available. Patient was given orders for supplemental oxygen and 40 meq of Lasix. Patient is now negative 2.9L.   Acute Hypoxic/Hypercarbic Respiratory Distress S/P lumbar laminectomy   Plan  Maintain Oxygenation >92 Pulmonary Hygiene : IS and flutter valve  Decrease sedating medications  Transfer to higher level of care  Will need BIPAP Strict I & Val [place]   Jovita KussmaulKatalina Eubanks, AG-ACNP [facility]  Pgr: [phone]  PCCM Pgr: [phone]  Attending note: I have seen and examined the patient with nurse practitioner/resident and agree with the note. History, labs and imaging reviewed.  I examined the pt with the NP. She looks stable but ABG shows mild hypercarbia. We are still awaiting SDU bed from early today AM. We will transition to ICU tonight and start Bipap.  The patient is critically ill with multiple organ systems failure and requires high complexity decision making for assessment and support, frequent evaluation and titration of therapies, application of advanced monitoring technologies and extensive interpretation of multiple databases.  Additional Critical care time - 35 mins. This represents my time independent of the NPs time taking care of the pt.  Chilton GreathousePraveen Rafe Mackowski MD [facility] Pager [phone] If no answer or after 3pm call: [phone] 01/23/2017, 5:22 PM

## 2017-01-24 ENCOUNTER — Inpatient Hospital Stay (HOSPITAL_COMMUNITY): Payer: Medicare Other

## 2017-01-24 DIAGNOSIS — J9690 Respiratory failure, unspecified, unspecified whether with hypoxia or hypercapnia: Secondary | ICD-10-CM | POA: Diagnosis not present

## 2017-01-24 LAB — BASIC METABOLIC PANEL
Anion gap: 5 (ref 5–15)
BUN: 11 mg/dL (ref 6–20)
CO2: 31 mmol/L (ref 22–32)
Calcium: 8.5 mg/dL — ABNORMAL LOW (ref 8.9–10.3)
Chloride: 102 mmol/L (ref 101–111)
Creatinine, Ser: 0.73 mg/dL (ref 0.44–1.00)
GFR calc Af Amer: >60 mL/min (ref >60)
GFR calc non Af Amer: >60 mL/min (ref >60)
Glucose, Bld: 92 mg/dL (ref 65–99)
Potassium: 4.3 mmol/L (ref 3.5–5.1)
Sodium: 138 mmol/L (ref 135–145)

## 2017-01-24 LAB — PHOSPHORUS: Phosphorus: 1.7 mg/dL — ABNORMAL LOW (ref 2.5–4.6)

## 2017-01-24 LAB — MAGNESIUM: Magnesium: 1.8 mg/dL (ref 1.7–2.4)

## 2017-01-24 MED ORDER — MAGNESIUM SULFATE 2 GM/50ML IV SOLN
2.0000 g | Freq: Once | INTRAVENOUS | Status: AC
Start: 2017-01-24 — End: 2017-01-24
  Administered 2017-01-24: 2 g via INTRAVENOUS
  Filled 2017-01-24: qty 50

## 2017-01-24 MED ORDER — DEXTROSE 5 % IV SOLN
20.0000 mmol | Freq: Once | INTRAVENOUS | Status: AC
  Administered 2017-01-24: 20 mmol via INTRAVENOUS
  Filled 2017-01-24: qty 6.67

## 2017-01-24 NOTE — Progress Notes (Signed)
PULMONARY / CRITICAL CARE MEDICINE   Name: Julie [name] MRN: [id number] DOB: 02-24-1946    ADMISSION DATE:  01/21/2017 CONSULTATION DATE:  1/25  REFERRING MD:  Dr. Venetia MaxonStern  CHIEF COMPLAINT:  Resp distress  BRIEF SUMMARY:   71 y/o female, never smoker, who had lumbar laminectomy 1/23, 1/25 was noted to be lethargic with shallow respirations & confusion post-operatively. Of note, the patient received narcotics and muscle relaxants early am.  Evaluated by NS and PCCM was consulted for moderated resp distress. Tx'd to SDU for observation.    SUBJECTIVE: Pt reports soreness from surgery.  Denies SOB.  Occasional cough but not productive.  Working on flutter valve.    VITAL SIGNS: BP (!) 114/50 (BP Location: Right Arm)   Pulse 75   Temp 98.9 F (37.2 C) (Oral)   Resp 15   Ht 5\' 1"  (1.549 m)   Wt 141 lb 8 oz (64.2 kg)   SpO2 97%   BMI 26.74 kg/m   HEMODYNAMICS:    VENTILATOR SETTINGS:    INTAKE / OUTPUT: I/O last 3 completed shifts: In: 180 [P.O.:180] Out: 3900 [Urine:3900]  PHYSICAL EXAMINATION: General: well developed adult female in NAD, sitting in bed HEENT: MM pink/moist, no jvd Neuro: AAOx4, speech clear, MAE CV: s1s2 rrr, no m/r/g PULM: non-labored, lungs bilaterally clear WU:JWJXGI:soft, non-tender, bsx4 active  Extremities: warm/dry, negative edema  Skin: no rashes or lesions   LABS:  BMET  Recent Labs Lab 01/23/17 1136 01/24/17 0418  NA 133* 138  K 4.9 4.3  CL 101 102  CO2 23 31  BUN 16 11  CREATININE 0.98 0.73  GLUCOSE 152* 92    Electrolytes  Recent Labs Lab 01/23/17 1136 01/24/17 0418  CALCIUM 8.5* 8.5*  MG  --  1.8  PHOS  --  1.7*    CBC  Recent Labs Lab 01/23/17 1136  WBC 6.5  HGB 9.5*  HCT 29.4*  PLT 190    Coag's No results for input(s): APTT, INR in the last 168 hours.  Sepsis Markers No results for input(s): LATICACIDVEN, PROCALCITON, O2SATVEN in the last 168 hours.  ABG  Recent Labs Lab 01/23/17 1615  01/23/17 2139  PHART 7.333* 7.390  PCO2ART 58.0* 52.6*  PO2ART 69.3* 78.8*    Liver Enzymes No results for input(s): AST, ALT, ALKPHOS, BILITOT, ALBUMIN in the last 168 hours.  Cardiac Enzymes No results for input(s): TROPONINI, PROBNP in the last 168 hours.  Glucose No results for input(s): GLUCAP in the last 168 hours.  Imaging No results found.   STUDIES:  1/25 CXR with RLL opacity   CULTURES: 1/25 BCx2 >>   ANTIBIOTICS:   SIGNIFICANT EVENTS: 1/23 lumber laminectomy 1/25 tx to SDU for pulmonary toilet  LINES/TUBES:   DISCUSSION: 71 y/o F, never smoker, s/p lumbar laminectomy 1/23 who developed lethargy, shallow respirations and confusion in the setting of narcotics/muscle relaxants.  PCCM called for respiratory distress.  Placed on bipap, pulmonary hygiene with improvement.     ASSESSMENT / PLAN:  1. Acute Respiratory Depression secondary to sedation - resolved  2. Pulmonary Edema / Atelectasis  P:   Continue pulmonary hygiene - IS, flutter, mobilize Follow intermittent CXR  Minimize narcotics / sedating agents as able  Wean O2 to off for sats >92% Monitor fever curve / WBC   3. Hx of GERD P:   PPI Diet as tolerated   4. Lethargy / AMS - s/p lumbar laminectomy in setting of narcotics / muscle relaxants - resolved P:  Monitor mental status PT as able    FAMILY  - Updates: Patient updated on plan of care.    PCCM will be available PRN.  Please call back if new needs / questions arise.    Canary Brim, NP-C [facility] Pgr: [phone] or if no answer [phone] 01/24/2017, 9:18 AM

## 2017-01-24 NOTE — Progress Notes (Addendum)
Subjective: Patient reports "I hurt"  Objective: Vital signs in last 24 hours: Temp:  [97.5 F (36.4 C)-100.2 F (37.9 C)] 98.8 F (37.1 C) (01/26 0530) Pulse Rate:  [81-116] 84 (01/26 0530) Resp:  [14-20] 17 (01/26 0530) BP: (89-120)/(43-69) 110/48 (01/26 0339) SpO2:  [87 %-100 %] 97 % (01/26 0530) Weight:  [64.2 kg (141 lb 8 oz)] 64.2 kg (141 lb 8 oz) (01/26 0530)  Intake/Output from previous day: 01/25 0701 - 01/26 0700 In: 180 [P.O.:180] Out: 2600 [Urine:2600] Intake/Output this shift: No intake/output data recorded.  Alert, conversant. Improved from yesterday, but remains quite weak. Now on stepdown for aggressive pulmonary toilet, working with respiratory therapy. Lower lobes diminished with rhonchi upper. Encouraged to cough. She c/o significant right side pain. Firm to palpation, without unusual bruising or swelling. Lumbar incisions without erythema, swelling, or drainage. All drsgs intact (honeycomb over Dermabond). moves BLE to command with good strength . Not yet out of bed d/t respiratory status.   Lab Results:  Recent Labs  01/23/17 1136  WBC 6.5  HGB 9.5*  HCT 29.4*  PLT 190   BMET  Recent Labs  01/23/17 1136 01/24/17 0418  NA 133* 138  K 4.9 4.3  CL 101 102  CO2 23 31  GLUCOSE 152* 92  BUN 16 11  CREATININE 0.98 0.73  CALCIUM 8.5* 8.5*    Studies/Results: Dg Chest Port 1 View  Result Date: 01/23/2017 CLINICAL DATA:  Chest congestion, shortness of Breath EXAM: PORTABLE CHEST 1 VIEW COMPARISON:  None. FINDINGS: Heart is borderline in size. Perihilar and lower lobe opacities are noted which could reflect mild edema, less likely infection. No effusions or acute bony abnormality. IMPRESSION: Borderline heart size with patchy perihilar and lower lobe opacities, favor edema/ CHF. Electronically Signed   By: Charlett NoseKevin  Dover M.D.   On: 01/23/2017 08:23    Assessment/Plan:   LOS: 3 days  Continue respiratory interventions, mobilizing when appropriate.  Appreciate CCM's assistance.    Georgiann Cockeroteat, Brian 01/24/2017, 7:21 AM  Some subcutaneous bruising on right side, no abdominal discomfort.  OK to advance pain medications as respiratory status improves.  I appreciate CCM input.

## 2017-01-24 NOTE — Progress Notes (Signed)
Physical Therapy Treatment Patient Details Name: Julie [name] MRN: [id number] DOB: [birth year] Today's Date: 01/24/2017    History of Present Illness Pt is a 71 y/o female, s/p L3, 4, 5 Fusion with screws. Developed respiratory depression/pulmonary edema and was on BiPAP 01/23/17.    PT Comments    Some improvement with activity tolerance today. Pt took several steps with RW to transfer from bed to recliner. Ambulation distance limited by new RLE pain (RN notified) and fatigue. SaO2 92% on RA, breathing remains labored. Performed seated BLE exercises. PT now recommending ST-SNF due to slow progress.    Follow Up Recommendations  Supervision/Assistance - 24 hour;SNF     Equipment Recommendations  Rolling walker with 5" wheels    Recommendations for Other Services       Precautions / Restrictions Precautions Precautions: Back;Fall Precaution Booklet Issued: Yes (comment) Precaution Comments: Reviewed precautions throughout functional mobility.  Required Braces or Orthoses: Spinal Brace Spinal Brace: Applied in sitting position Restrictions Weight Bearing Restrictions: No    Mobility  Bed Mobility Overal bed mobility: Needs Assistance Bed Mobility: Rolling;Sit to Sidelying;Sidelying to Sit Rolling: Mod assist Sidelying to sit: Min assist       General bed mobility comments: VC's and hands-on assist for proper sequencing and technique.   Transfers Overall transfer level: Needs assistance Equipment used: Rolling walker (2 wheeled) Transfers: Sit to/from Stand Sit to Stand: Min guard         General transfer comment: min/guard for safety  Ambulation/Gait Ambulation/Gait assistance: Min guard Ambulation Distance (Feet): 2 Feet Assistive device: Rolling walker (2 wheeled) Gait Pattern/deviations: Step-through pattern;Decreased stride length Gait velocity: Decreased   General Gait Details: pt took several pivotal steps from bed to recliner with RW, distance  limited by pain in R thigh and foot (pt reports this is new pain, she didn't mention it to Dr, RN notified), SaO2 92% on RA, breathing labored, non productive cough   Stairs            Wheelchair Mobility    Modified Rankin (Stroke Patients Only)       Balance Overall balance assessment: Needs assistance Sitting-balance support: Single extremity supported Sitting balance-Leahy Scale: Fair     Standing balance support: Bilateral upper extremity supported;During functional activity Standing balance-Leahy Scale: Poor Standing balance comment: could maintain neutral with BUE support                    Cognition Arousal/Alertness: Awake/alert Behavior During Therapy: WFL for tasks assessed/performed Overall Cognitive Status: Within Functional Limits for tasks assessed                      Exercises General Exercises - Lower Extremity Ankle Circles/Pumps: AROM;Both;10 reps;Seated Long Arc Quad: AROM;Both;10 reps;Seated    General Comments        Pertinent Vitals/Pain Pain Score: 7  Pain Location: Low back and RLE Pain Descriptors / Indicators: Sore Pain Intervention(s): Limited activity within patient's tolerance;Monitored during session;Premedicated before session    Home Living                      Prior Function            PT Goals (current goals can now be found in the care plan section) Acute Rehab PT Goals Patient Stated Goal: Go home with husband assist PRN for ADL's and self care PT Goal Formulation: With patient/family Time For Goal Achievement: 01/29/17 Potential to Achieve Goals:  Good Progress towards PT goals: Progressing toward goals    Frequency    Min 5X/week      PT Plan Discharge plan needs to be updated    Co-evaluation             End of Session Equipment Utilized During Treatment: Back brace Activity Tolerance: Patient limited by fatigue;Patient limited by pain Patient left: in chair;with call  bell/phone within reach;with family/visitor present     Time: 1610-9604 PT Time Calculation (min) (ACUTE ONLY): 20 min  Charges:  $Therapeutic Activity: 8-22 mins                    G Codes:      Julie [name] 01/24/2017, 12:54 PM [phone]

## 2017-01-24 NOTE — Care Management (Addendum)
Case Management Note Initial Note Started By Julie [name] 01-23-17 Patient Details  Name: Julie [name] MRN: [id number] Date of Birth: 1946/05/08  Subjective/Objective:    71 yr old female s/p L3,4,5 anterolateral lumbar interbody fusion  with percutaneous screws.                  Action/Plan: CM did not speak with patient/family, she is experiencing respiratory difficulties and waiting transfer to stepdown. CM will follow.   Expected Discharge Date:    pending                    Expected Discharge Plan:  Home w Home Health Services  In-House Referral:     Discharge planning Services  CM Consult  Post Acute Care Choice:  Durable Medical Equipment, Home Health Choice offered to: Patient     DME Arranged:  N/A  DME Agency:   NA  HH Arranged:  N/A- Refused Services HH Agency:  N/A   Status of Service:  Completed  If discussed at [employer] Length of Stay Meetings, dates discussed:  01-28-17  Additional Comments: 01-27-17 1601 Julie BambergerBrenda Graves-Bigelow, RN, BSN [phone] CM did speak with pt in regards to disposition needs [facility] and husband has taken home. Pt states she feels like it will be appropriate for height youth size. CM did ask if she needed to call family and pt states she will inform family. No further needs from CM at this time.   1422 01-24-17 Julie BambergerBrenda Graves-Bigelow, RN, BSN [phone] Pt is a transfer to 3 west due to Respiratory Failure with Hypoxemia. Initiated on BIPAP. Pt is from home with 24/hr supervision. At time of visit granddaughter at bedside. Home Health Agency List provided to family. [facility] CM will continue to monitor for additional needs such as DME and if qualifies for home 02.

## 2017-01-25 ENCOUNTER — Inpatient Hospital Stay (HOSPITAL_COMMUNITY): Payer: Medicare Other

## 2017-01-25 DIAGNOSIS — J80 Acute respiratory distress syndrome: Secondary | ICD-10-CM

## 2017-01-25 LAB — ECHOCARDIOGRAM COMPLETE
Height: 61 in
Weight: 2212.8 oz

## 2017-01-25 NOTE — Procedures (Signed)
Pt is resting comfortably on room air. HR-71, RR-14, SpO2-95.  Pt declines use of BiPAP this evening.

## 2017-01-25 NOTE — Progress Notes (Addendum)
WRONG PATIENT CHART

## 2017-01-25 NOTE — Progress Notes (Signed)
Patient ID: Julie [name], female   DOB: 1946-07-25, 71 y.o.   MRN: [id number] BP (!) 130/52 (BP Location: Right Arm)   Pulse 79   Temp 97.8 F (36.6 C) (Oral)   Resp 13   Ht 5\' 1"  (1.549 m)   Wt 62.7 kg (138 lb 4.8 oz)   SpO2 92%   BMI 26.13 kg/m  Alert and oriented x 4, speech is clear and fluent Moving all extremities well Respiratory status is improving, not ready for discharge today.

## 2017-01-25 NOTE — Progress Notes (Signed)
Echocardiogram 2D Echocardiogram has been performed.  01/25/2017 12:04 PM Gertie FeyMichelle Ailin Rochford, BS, RVT, RDCS, RDMS

## 2017-01-25 NOTE — Progress Notes (Signed)
Physical Therapy Treatment Patient Details Name: Julie [name] MRN: [id number] DOB: June 25, 1946 Today's Date: 01/25/2017    History of Present Illness Pt is a 71 y/o female, s/p L3, 4, 5 Fusion with screws. Developed respiratory depression/pulmonary edema and was on BiPAP 01/23/17.    PT Comments    Pt feeling well, moving much better and aware of 2/3 precautions with education for all and handout provided. Pt with min assist to don brace and minguard-supervision for all other activities with spouse present, aware and educated for all precautions and mobility. Will continue to follow acutely and recommend ambulation with spouse and nursing supervision throughout the day.    sats 92-95% on RA throughout HR 79  Follow Up Recommendations  Home health PT     Equipment Recommendations  Rolling walker with 5" wheels    Recommendations for Other Services       Precautions / Restrictions Precautions Precautions: Back Precaution Booklet Issued: Yes (comment) Precaution Comments: educated for all precautions with handout provided Required Braces or Orthoses: Spinal Brace Spinal Brace: Applied in sitting position Restrictions Weight Bearing Restrictions: No    Mobility  Bed Mobility Overal bed mobility: Needs Assistance Bed Mobility: Rolling;Sidelying to Sit Rolling: Min guard Sidelying to sit: Min guard       General bed mobility comments: cues for sequence, precautions and safety  Transfers Overall transfer level: Needs assistance   Transfers: Sit to/from Stand Sit to Stand: Supervision         General transfer comment: cues for posture and safety  Ambulation/Gait Ambulation/Gait assistance: Supervision Ambulation Distance (Feet): 350 Feet Assistive device: Rolling walker (2 wheeled) Gait Pattern/deviations: Step-through pattern;Decreased stride length   Gait velocity interpretation: Below normal speed for age/gender General Gait Details: cues for position in  RW   Stairs            Wheelchair Mobility    Modified Rankin (Stroke Patients Only)       Balance Overall balance assessment: Modified Independent   Sitting balance-Leahy Scale: Good       Standing balance-Leahy Scale: Fair                      Cognition Arousal/Alertness: Awake/alert Behavior During Therapy: WFL for tasks assessed/performed Overall Cognitive Status: Within Functional Limits for tasks assessed                      Exercises      General Comments        Pertinent Vitals/Pain Pain Score: 5  Pain Location: Low back and RLE Pain Descriptors / Indicators: Sore Pain Intervention(s): Limited activity within patient's tolerance;Repositioned;Monitored during session    Home Living                      Prior Function            PT Goals (current goals can now be found in the care plan section) Progress towards PT goals: Progressing toward goals    Frequency           PT Plan Discharge plan needs to be updated    Co-evaluation             End of Session Equipment Utilized During Treatment: Back brace Activity Tolerance: Patient tolerated treatment well Patient left: in chair;with call bell/phone within reach;with family/visitor present     Time: 1040-1108 PT Time Calculation (min) (ACUTE ONLY): 28 min  Charges:  $Gait  Training: 8-22 mins $Therapeutic Activity: 8-22 mins                    G Codes:      Eean Buss B Latrisa Hellums 01/25/2017, 11:45 AM Delaney MeigsMaija Tabor Ameshia Pewitt, PT [phone]

## 2017-01-26 NOTE — Progress Notes (Signed)
Physical Therapy Treatment Patient Details Name: Julie [name] MRN: [id number] DOB: [birth year] Today's Date: 01/26/2017    History of Present Illness Pt is a 71 y/o female, s/p L3, 4, 5 Fusion with screws. Developed respiratory depression/pulmonary edema and was on BiPAP 01/23/17.    PT Comments    Eager to get OOB and walk despite 7-8/10 back pain (RN gave meds for pain during session); Moving slowly, but overall moving well, and VSS throughout session (O2 sats greater than or equal to 94%); less distance walked today, but that is due to her pain; positioned in supported upright in recliner, LEs down, but supported on floor with blankets to bolster end of session.  Follow Up Recommendations  Home health PT     Equipment Recommendations  Rolling walker with 5" wheels (youth-sized)    Recommendations for Other Services OT consult     Precautions / Restrictions Precautions Precautions: Back Precaution Booklet Issued: Yes (comment) Precaution Comments: educated for all precautions with handout provided Required Braces or Orthoses: Spinal Brace Spinal Brace: Applied in sitting position    Mobility  Bed Mobility Overal bed mobility: Needs Assistance Bed Mobility: Rolling;Sidelying to Sit Rolling: Min guard Sidelying to sit: Min guard       General bed mobility comments: cues for sequence, precautions and safety  Transfers Overall transfer level: Needs assistance Equipment used: Rolling walker (2 wheeled) Transfers: Sit to/from Stand Sit to Stand: Supervision         General transfer comment: cues for posture and safety, as well as hand placement and control of descent to sit  Ambulation/Gait Ambulation/Gait assistance: Supervision Ambulation Distance (Feet): 155 Feet Assistive device: Rolling walker (2 wheeled) Gait Pattern/deviations: Step-through pattern;Decreased stride length Gait velocity: Decreased   General Gait Details: cues for position in RW, and to  self-monitor for activity tolerance; VSS throughout walk   Stairs            Wheelchair Mobility    Modified Rankin (Stroke Patients Only)       Balance     Sitting balance-Leahy Scale: Good       Standing balance-Leahy Scale: Fair                      Cognition Arousal/Alertness: Awake/alert Behavior During Therapy: WFL for tasks assessed/performed Overall Cognitive Status: Within Functional Limits for tasks assessed                      Exercises      General Comments        Pertinent Vitals/Pain Pain Assessment: Faces Faces Pain Scale: Hurts whole lot Pain Location: Low back and RLE Pain Descriptors / Indicators: Aching;Grimacing Pain Intervention(s): RN gave pain meds during session    Home Living                      Prior Function            PT Goals (current goals can now be found in the care plan section) Acute Rehab PT Goals Patient Stated Goal: Go home with husband assist PRN for ADL's and self care PT Goal Formulation: With patient/family Time For Goal Achievement: 01/29/17 Potential to Achieve Goals: Good Progress towards PT goals: Progressing toward goals    Frequency    Min 5X/week      PT Plan Current plan remains appropriate    Co-evaluation  End of Session Equipment Utilized During Treatment: Back brace Activity Tolerance: Patient tolerated treatment well Patient left: in chair;with call bell/phone within reach;with family/visitor present     Time: 0902-0931 PT Time Calculation (min) (ACUTE ONLY): 29 min  Charges:  $Gait Training: 8-22 mins $Therapeutic Activity: 8-22 mins                    G Codes:      Levi AlandHolly H Karson Chicas 01/26/2017, 9:38 AM  Van ClinesHolly Nashly Olsson, PT  Acute Rehabilitation Services Pager [phone] Office [phone]

## 2017-01-26 NOTE — Progress Notes (Signed)
PCCM servce/ sign off note  Able to walk the circle s 02, no cough or cp or sob   Lungs clear on exam / sats 96% RA  Imp: acute resp failure resolved  Rec: mobilize/ IS/ judicious use of opioids per NS/ pulmonary f/u is PRN    Sandrea HughsMichael Wert, MD Pulmonary and Critical Care Medicine [facility] Cell [phone] After 5:30 PM or weekends, use Beeper [phone]

## 2017-01-26 NOTE — Progress Notes (Signed)
Patient ID: Julie [name], female   DOB: June 03, 1946, 71 y.o.   MRN: [id number] Subjective: Patient reports more pain in back and R thigh today, no dyspnea  Objective: Vital signs in last 24 hours: Temp:  [97.5 F (36.4 C)-99.4 F (37.4 C)] 98.5 F (36.9 C) (01/28 0810) Pulse Rate:  [64-79] 66 (01/28 0810) Resp:  [9-27] 13 (01/28 0810) BP: (120-146)/(54-65) 139/54 (01/28 0810) SpO2:  [92 %-96 %] 96 % (01/28 0810) Weight:  [64.9 kg (143 lb 1.6 oz)] 64.9 kg (143 lb 1.6 oz) (01/28 0500)  Intake/Output from previous day: 01/27 0701 - 01/28 0700 In: 660 [P.O.:660] Out: 1950 [Urine:1950] Intake/Output this shift: Total I/O In: 180 [P.O.:180] Out: -   Neurologic: Grossly normal  Lab Results: Lab Results  Component Value Date   WBC 6.5 01/23/2017   HGB 9.5 (L) 01/23/2017   HCT 29.4 (L) 01/23/2017   MCV 93.3 01/23/2017   PLT 190 01/23/2017   Lab Results  Component Value Date   INR 0.92 04/07/2014   BMET Lab Results  Component Value Date   NA 138 01/24/2017   K 4.3 01/24/2017   CL 102 01/24/2017   CO2 31 01/24/2017   GLUCOSE 92 01/24/2017   BUN 11 01/24/2017   CREATININE 0.73 01/24/2017   CALCIUM 8.5 (L) 01/24/2017    Studies/Results: No results found.  Assessment/Plan: More pain today, work on pain control, mobilize   LOS: 5 days    Anea Fodera S 01/26/2017, 11:13 AM

## 2017-01-26 NOTE — Progress Notes (Signed)
Ambulated halfway around circle. Oxygen saturation dropped to 72 %. Pt noted holding her br.eath while ambulating. States she was hurting while ambulating. Had asked to stop walking and rest, and as she was resting her oxygen saturation immediately went back up to 97%.

## 2017-01-27 LAB — URINALYSIS, ROUTINE W REFLEX MICROSCOPIC
Bilirubin Urine: NEGATIVE
Glucose, UA: NEGATIVE mg/dL
Ketones, ur: NEGATIVE mg/dL
Nitrite: NEGATIVE
Protein, ur: 100 mg/dL — AB
Specific Gravity, Urine: 1.027 (ref 1.005–1.030)
pH: 5 (ref 5.0–8.0)

## 2017-01-27 LAB — CBC WITH DIFFERENTIAL/PLATELET
Basophils Absolute: 0 10*3/uL (ref 0.0–0.1)
Basophils Relative: 0 %
Eosinophils Absolute: 0 10*3/uL (ref 0.0–0.7)
Eosinophils Relative: 0 %
HCT: 27.9 % — ABNORMAL LOW (ref 36.0–46.0)
Hemoglobin: 8.9 g/dL — ABNORMAL LOW (ref 12.0–15.0)
Lymphocytes Relative: 17 %
Lymphs Abs: 1.8 10*3/uL (ref 0.7–4.0)
MCH: 28.8 pg (ref 26.0–34.0)
MCHC: 31.9 g/dL (ref 30.0–36.0)
MCV: 90.3 fL (ref 78.0–100.0)
Monocytes Absolute: 1.3 10*3/uL — ABNORMAL HIGH (ref 0.1–1.0)
Monocytes Relative: 12 %
Neutro Abs: 7.8 10*3/uL — ABNORMAL HIGH (ref 1.7–7.7)
Neutrophils Relative %: 71 %
Platelets: 274 10*3/uL (ref 150–400)
RBC: 3.09 MIL/uL — ABNORMAL LOW (ref 3.87–5.11)
RDW: 13 % (ref 11.5–15.5)
WBC: 11 10*3/uL — ABNORMAL HIGH (ref 4.0–10.5)

## 2017-01-27 NOTE — Progress Notes (Signed)
Patient ID: Julie [name], female   DOB: [birth year], 71 y.o.   MRN: [id number] Alert, smiling; husband present. States,"I feel better".  No tremor noticed, but pt reports they persist. CBCw/diff & U/A were ordered to evaluate her report of burning with urination. Will monitor. Continue to mobilize in brace. Incisions look good.

## 2017-01-27 NOTE — Progress Notes (Signed)
Pt c/o burning when trying to urinate MD paged

## 2017-01-27 NOTE — Care Management Important Message (Signed)
Important Message  Patient Details  Name: Linard Millersatricia M Lipsky MRN: [id number] Date of Birth: 28-Dec-1946   Medicare Important Message Given:  Yes    Kyla BalzarineShealy, Storie Heffern Abena 01/27/2017, 2:04 PM

## 2017-01-27 NOTE — Progress Notes (Signed)
Patient ID: Julie [name], female   DOB: 29-Nov-1946, 71 y.o.   MRN: [id number] Alert, conversant, smiling. Much improved respiratory status. She has ambulated this morning. Good strength BLE. Incisions without erythema, swelling, or drainage right side and back. Bruising at right side incisions evolving. Belly soft. No tenderness belly or incisions. Up most of the night with bowel movements, fatigued at present. VS looking good. Will try to stay with Norco & Robaxin today.

## 2017-01-27 NOTE — Progress Notes (Signed)
RN paged MD on call regarding new labs and UA. Will await further orders.

## 2017-01-27 NOTE — Progress Notes (Signed)
Pt has tremors in all four extremities. States this has been going on for three or four days. Pt states she is not anxious about anything and does not where these tremors came from. Paged MD. Awaiting call back

## 2017-01-27 NOTE — Progress Notes (Signed)
Physical Therapy Treatment Patient Details Name: Julie [name] MRN: [id number] DOB: [birth year] Today's Date: 01/27/2017    History of Present Illness Pt is a 71 y/o female, s/p L3, 4, 5 Fusion with screws. Developed respiratory depression/pulmonary edema and was on BiPAP 01/23/17.    PT Comments    Pt mobilizing slowly during PT session. Able to ambulate 140 ft with rw but very guarded and slow pattern. Pt reports having a rough night with pain and not doing as well this morning. Pt declined sitting in chair after session, encouraged pt to get up to chair later when pain more controlled with nursing assist. Pt and spouse in agreement. PT to continue to follow and attempt to progress mobility as tolerated.   Follow Up Recommendations  Home health PT     Equipment Recommendations  Rolling walker with 5" wheels (youth-sized)    Recommendations for Other Services       Precautions / Restrictions Precautions Precautions: Back Precaution Comments: reviewed precautions Spinal Brace: Applied in standing position Restrictions Weight Bearing Restrictions: No    Mobility  Bed Mobility Overal bed mobility: Needs Assistance Bed Mobility: Sit to Supine;Rolling Rolling: Supervision       Sit to sidelying: Min assist General bed mobility comments: min assist needed with LEs   Transfers Overall transfer level: Needs assistance Equipment used: Rolling walker (2 wheeled) Transfers: Sit to/from Stand Sit to Stand: Supervision         General transfer comment: cues to use hands for assist and safety.  Transfers performed from EOB and toilet.   Ambulation/Gait Ambulation/Gait assistance: Min guard Ambulation Distance (Feet): 140 Feet (plus 10 ft in room prior) Assistive device: Rolling walker (2 wheeled) Gait Pattern/deviations: Step-through pattern;Decreased step length - right;Decreased step length - left Gait velocity: slow pattern   General Gait Details: guarded gait pattern,  drifting to Lt when using rw. Occasional assist for maneuvering rw around objects in room/hall.    Stairs            Wheelchair Mobility    Modified Rankin (Stroke Patients Only)       Balance Overall balance assessment: Needs assistance Sitting-balance support: No upper extremity supported Sitting balance-Leahy Scale: Fair     Standing balance support: Bilateral upper extremity supported Standing balance-Leahy Scale: Poor Standing balance comment: using rw for support                    Cognition Arousal/Alertness: Awake/alert Behavior During Therapy: WFL for tasks assessed/performed Overall Cognitive Status: Within Functional Limits for tasks assessed                      Exercises      General Comments General comments (skin integrity, edema, etc.): SpO2 staying 92% or above during ambulation.       Pertinent Vitals/Pain Pain Assessment: Faces Faces Pain Scale: Hurts whole lot Pain Location: back Pain Descriptors / Indicators: Guarding;Grimacing Pain Intervention(s): Limited activity within patient's tolerance;Monitored during session;RN gave pain meds during session    Home Living                      Prior Function            PT Goals (current goals can now be found in the care plan section) Acute Rehab PT Goals Patient Stated Goal: keep improving, have less pain PT Goal Formulation: With patient/family Time For Goal Achievement: 01/29/17 Potential to Achieve Goals: Good  Progress towards PT goals: Progressing toward goals    Frequency    Min 5X/week      PT Plan Current plan remains appropriate    Co-evaluation             End of Session Equipment Utilized During Treatment: Back brace Activity Tolerance: Patient limited by pain Patient left: in bed;with call bell/phone within reach;with family/visitor present     Time: [id number] PT Time Calculation (min) (ACUTE ONLY): 27 min  Charges:  $Gait  Training: 8-22 mins $Therapeutic Activity: 8-22 mins                    G Codes:      Christiane HaBenjamin J. Jonny Dearden, PT, CSCS Pager [phone] Office [phone]  01/27/2017, 9:22 AM

## 2017-01-27 NOTE — Progress Notes (Signed)
Subjective: Patient reports pain in right side. Anxious.  Objective: Vital signs in last 24 hours: Temp:  [98.1 F (36.7 C)-98.5 F (36.9 C)] 98.2 F (36.8 C) (01/29 0555) Pulse Rate:  [61-72] 71 (01/29 0555) Resp:  [12-14] 12 (01/29 0555) BP: (129-147)/(42-62) 129/59 (01/29 0555) SpO2:  [96 %-97 %] 97 % (01/29 0555) Weight:  [63.6 kg (140 lb 4.8 oz)] 63.6 kg (140 lb 4.8 oz) (01/29 0555)  Intake/Output from previous day: 01/28 0701 - 01/29 0700 In: 600 [P.O.:600] Out: -  Intake/Output this shift: No intake/output data recorded.  Physical Exam: Some bruising along right flank, incisions soft.  Good hip flexor strength.  Dressings CDI.  Lab Results: No results for input(s): WBC, HGB, HCT, PLT in the last 72 hours. BMET No results for input(s): NA, K, CL, CO2, GLUCOSE, BUN, CREATININE, CALCIUM in the last 72 hours.  Studies/Results: No results found.  Assessment/Plan: Some bruising along right flank, incisions soft.  Good hip flexor strength.  Dressings CDI.  Will use pain medication judiciously today.  Continue to work with PT.  I reassured patient.  Pulmonary status improving.    LOS: 6 days    Dorian HeckleSTERN,Dennys Traughber D, MD 01/27/2017, 7:43 AM

## 2017-01-28 LAB — CULTURE, BLOOD (ROUTINE X 2)
Culture: NO GROWTH
Culture: NO GROWTH

## 2017-01-28 MED ORDER — CIPROFLOXACIN HCL 500 MG PO TABS
500.0000 mg | ORAL_TABLET | Freq: Two times a day (BID) | ORAL | Status: DC
  Administered 2017-01-28: 500 mg via ORAL
  Filled 2017-01-28: qty 1

## 2017-01-28 MED FILL — Electrolyte-R (PH 7.4) Solution: INTRAVENOUS | Qty: 4000 | Status: AC

## 2017-01-28 MED FILL — Heparin Sodium (Porcine) Inj 1000 Unit/ML: INTRAMUSCULAR | Qty: 30 | Status: AC

## 2017-01-28 NOTE — Progress Notes (Addendum)
Subjective: Patient reports "I'm doing better. I want to go home"  Objective: Vital signs in last 24 hours: Temp:  [97.9 F (36.6 C)-98.5 F (36.9 C)] 97.9 F (36.6 C) (01/30 0532) Pulse Rate:  [59-80] 80 (01/30 0532) Resp:  [17-20] 20 (01/30 0000) BP: (117-148)/(44-69) 139/49 (01/30 0532) SpO2:  [96 %-100 %] 96 % (01/30 0532) Weight:  [63.3 kg (139 lb 8 oz)] 63.3 kg (139 lb 8 oz) (01/30 0532)  Intake/Output from previous day: 01/29 0701 - 01/30 0700 In: 840 [P.O.:840] Out: 150 [Urine:150] Intake/Output this shift: No intake/output data recorded.  Alert, conversant, sitting up completing bath at sink. Husband present. Pt notes mild persistent tremor in legs only, attributed to "weakness".  Strength is good BLE. Incisions with evolving bruises, no tenderness, no erythema, swelling, or drainage beneath Dermabond and honeycomb. Respiratory status much improved.   Lab Results:  Recent Labs  01/27/17 1755  WBC 11.0*  HGB 8.9*  HCT 27.9*  PLT 274   BMET No results for input(s): NA, K, CL, CO2, GLUCOSE, BUN, CREATININE, CALCIUM in the last 72 hours.  Studies/Results: No results found.  Assessment/Plan: improving  LOS: 7 days  Per DrStern, Cipro 500mg  bid po (start this am before d/c home), ok to d/c home. Norco 10/325, Robaxin 500mg , & Cipro 500mg  eRxed by DrStern for home use. Pt verbalizes understanding of d/c instructions.    Julie Cockeroteat, Julie [name] 01/28/2017, 7:43 AM  Patient doing much better today.  Discharge home.

## 2017-01-28 NOTE — Progress Notes (Signed)
Discharge instructions reviewed with patient, all questions and concerns answered. This RN clarified with attending team, Georgiann CockerBrian Poteat that new prescriptions, Robaxin, Cipro, and Norco,  will be sent to patients pharmacy and that it is okay they do not appear on patients AVS. Geronimo Bootownes, Khai Arrona R, RN

## 2017-01-28 NOTE — Discharge Summary (Signed)
Physician Discharge Summary  Patient ID: Julie [name] MRN: [id number] DOB/AGE: [birth year] 71 y.o.  Admit date: 01/21/2017 Discharge date: 01/28/2017  Admission Diagnoses:LUMBAR FORAMINAL STENOSIS with scoliosis, lateral spondylolisthesis, herniated lumbar disc, lumbago, radiculopathy L 34, L 45 levels   Discharge Diagnoses: LUMBAR FORAMINAL STENOSIS with scoliosis, lateral spondylolisthesis, herniated lumbar disc, lumbago, radiculopathy L 34, L 45 levels s/p RIGHT LUMBAR THREE-FOUR LUMBAR FOUR - FIVE ANTEROLATERAL LUMBAR INTERBODY FUSION WITH PERCUTANEOUS PEDICLE SCREWS (Right) - RIGHT L3-4 L4-5 ANTEROLATERAL LUMBAR INTERBODY FUSION WITH PERCUTANEOUS PEDICLE SCREWS LUMBAR PERCUTANEOUS PEDICLE SCREW 2 LEVEL (Right)    Active Problems:   Lumbar spine scoliosis   Respiratory failure [facility])   Discharged Condition: good  Hospital Course: Julie [name] was admitted for surgery with dx foraminal stenosis, listhesis, and radiculopathy. Following uncomplicated decompression and fusion L3-4, L4-5 (rightXLIF, with percutaneous pedicle screws) she recovered nicely. She mobilized slowly secondary to respiratory difficulties, which resolved with aggressive pulmonary toilet. She is mobilizing well. She will be discharged on Cipro for UTI.    Consults: pulmonary/intensive care  Significant Diagnostic Studies: radiology: X-Ray: intra-op  Treatments: surgery: RIGHT LUMBAR THREE-FOUR LUMBAR FOUR - FIVE ANTEROLATERAL LUMBAR INTERBODY FUSION WITH PERCUTANEOUS PEDICLE SCREWS (Right) - RIGHT L3-4 L4-5 ANTEROLATERAL LUMBAR INTERBODY FUSION WITH PERCUTANEOUS PEDICLE SCREWS LUMBAR PERCUTANEOUS PEDICLE SCREW 2 LEVEL (Right)     Discharge Exam: Blood pressure (!) 139/49, pulse 80, temperature 97.9 F (36.6 C), temperature source Oral, resp. rate 20, height 5\' 1"  (1.549 m), weight 63.3 kg (139 lb 8 oz), SpO2 96 %. Alert, conversant, sitting up completing bath at sink. Husband present. Pt notes mild persistent  tremor in legs only, attributed to "weakness".  Strength is good BLE. Incisions with evolving bruises, no tenderness, no erythema, swelling, or drainage beneath Dermabond and honeycomb. Respiratory status much improved.      Disposition: 01-Home or Self Care Per DrStern, Cipro 500mg  bid po (start this am before d/c home), ok to d/c home. Norco 10/325, Robaxin 500mg , & Cipro 500mg  eRxed by DrStern for home use. Pt verbalizes understanding of d/c instructions.      Discharge Instructions    Call MD for:    Complete by:  As directed    Call MD for:  difficulty breathing, headache or visual disturbances    Complete by:  As directed    Call MD for:  persistant nausea and vomiting    Complete by:  As directed    Call MD for:  redness, tenderness, or signs of infection (pain, swelling, redness, odor or green/yellow discharge around incision site)    Complete by:  As directed    Call MD for:  severe uncontrolled pain    Complete by:  As directed    Diet - low sodium heart healthy    Complete by:  As directed    Increase activity slowly    Complete by:  As directed      Allergies as of 01/28/2017      Reactions   No Known Allergies     Discharge Instructions    Call MD for:    Complete by:  As directed    Call MD for:  difficulty breathing, headache or visual disturbances    Complete by:  As directed    Call MD for:  persistant nausea and vomiting    Complete by:  As directed    Call MD for:  redness, tenderness, or signs of infection (pain, swelling, redness, odor or green/yellow discharge around incision site)  Complete by:  As directed    Call MD for:  severe uncontrolled pain    Complete by:  As directed    Diet - low sodium heart healthy    Complete by:  As directed    Increase activity slowly    Complete by:  As directed      Allergies as of 01/28/2017      Reactions   No Known Allergies       Medication List    STOP taking these medications   ibuprofen 200 MG  tablet Commonly known as:  ADVIL,MOTRIN   meloxicam 15 MG tablet Commonly known as:  MOBIC     TAKE these medications   ALPRAZolam 0.5 MG tablet Commonly known as:  XANAX Take 0.5 mg by mouth 2 (two) times daily.   atorvastatin 20 MG tablet Commonly known as:  LIPITOR Take 20 mg by mouth at bedtime.   escitalopram 10 MG tablet Commonly known as:  LEXAPRO Take 10 mg by mouth daily.   estradiol 0.5 MG tablet Commonly known as:  ESTRACE Take 0.5 mg by mouth every other day.   fluticasone 50 MCG/ACT nasal spray Commonly known as:  FLONASE Place 1 spray into both nostrils daily as needed for allergies or rhinitis.   pantoprazole 40 MG tablet Commonly known as:  PROTONIX Take 40 mg by mouth daily.   traMADol 50 MG tablet Commonly known as:  ULTRAM Take 50 mg by mouth daily as needed for moderate pain.   traZODone 100 MG tablet Commonly known as:  DESYREL Take 100 mg by mouth 2 (two) times daily.            Durable Medical Equipment        Start     Ordered   01/22/17 [phone]  For home use only DME Walker rolling  Once    Question:  Patient needs a walker to treat with the following condition  Answer:  Unsteady gait   01/22/17 0924       Signed: Dorian HeckleSTERN,Umberto Pavek D, MD 01/28/2017, 8:22 AM

## 2017-01-30 LAB — URINE CULTURE: Culture: >=100000 — AB

## 2017-05-09 ENCOUNTER — Emergency Department (HOSPITAL_COMMUNITY)
Admission: EM | Admit: 2017-05-09 | Discharge: 2017-05-09 | Disposition: A | Discharge status: Home / Self Care | Payer: Medicare Other | Attending: Emergency Medicine | Admitting: Emergency Medicine

## 2017-05-09 ENCOUNTER — Emergency Department (HOSPITAL_COMMUNITY): Payer: Medicare Other

## 2017-05-09 ENCOUNTER — Encounter (HOSPITAL_COMMUNITY): Payer: Self-pay | Admitting: Emergency Medicine

## 2017-05-09 DIAGNOSIS — K2100 Gastro-esophageal reflux disease with esophagitis, without bleeding: Secondary | ICD-10-CM

## 2017-05-09 DIAGNOSIS — R0789 Other chest pain: Secondary | ICD-10-CM

## 2017-05-09 DIAGNOSIS — Z79899 Other long term (current) drug therapy: Secondary | ICD-10-CM | POA: Diagnosis not present

## 2017-05-09 LAB — DIFFERENTIAL
Basophils Absolute: 0 10*3/uL (ref 0.0–0.1)
Basophils Relative: 0 %
Eosinophils Absolute: 0.1 10*3/uL (ref 0.0–0.7)
Eosinophils Relative: 1 %
Lymphocytes Relative: 34 %
Lymphs Abs: 1.9 10*3/uL (ref 0.7–4.0)
Monocytes Absolute: 0.4 10*3/uL (ref 0.1–1.0)
Monocytes Relative: 7 %
Neutro Abs: 3.2 10*3/uL (ref 1.7–7.7)
Neutrophils Relative %: 58 %

## 2017-05-09 LAB — BASIC METABOLIC PANEL
Anion gap: 6 (ref 5–15)
BUN: 13 mg/dL (ref 6–20)
CO2: 28 mmol/L (ref 22–32)
Calcium: 9.2 mg/dL (ref 8.9–10.3)
Chloride: 104 mmol/L (ref 101–111)
Creatinine, Ser: 0.79 mg/dL (ref 0.44–1.00)
GFR calc Af Amer: >60 mL/min (ref >60)
GFR calc non Af Amer: >60 mL/min (ref >60)
Glucose, Bld: 100 mg/dL — ABNORMAL HIGH (ref 65–99)
Potassium: 4.1 mmol/L (ref 3.5–5.1)
Sodium: 138 mmol/L (ref 135–145)

## 2017-05-09 LAB — HEPATIC FUNCTION PANEL
ALT: 13 U/L — ABNORMAL LOW (ref 14–54)
AST: 19 U/L (ref 15–41)
Albumin: 4 g/dL (ref 3.5–5.0)
Alkaline Phosphatase: 85 U/L (ref 38–126)
Bilirubin, Direct: <0.1 mg/dL — ABNORMAL LOW (ref 0.1–0.5)
Total Bilirubin: 0.2 mg/dL — ABNORMAL LOW (ref 0.3–1.2)
Total Protein: 6.8 g/dL (ref 6.5–8.1)

## 2017-05-09 LAB — CBC
HCT: 40.2 % (ref 36.0–46.0)
Hemoglobin: 13.3 g/dL (ref 12.0–15.0)
MCH: 29.8 pg (ref 26.0–34.0)
MCHC: 33.1 g/dL (ref 30.0–36.0)
MCV: 89.9 fL (ref 78.0–100.0)
Platelets: 255 10*3/uL (ref 150–400)
RBC: 4.47 MIL/uL (ref 3.87–5.11)
RDW: 12.7 % (ref 11.5–15.5)
WBC: 5.4 10*3/uL (ref 4.0–10.5)

## 2017-05-09 LAB — I-STAT TROPONIN, ED: Troponin i, poc: 0 ng/mL (ref 0.00–0.08)

## 2017-05-09 LAB — TROPONIN I
Troponin I: <0.03 ng/mL (ref <0.03)
Troponin I: <0.03 ng/mL (ref <0.03)

## 2017-05-09 LAB — LIPASE, BLOOD: Lipase: 28 U/L (ref 11–51)

## 2017-05-09 MED ORDER — GI COCKTAIL ~~LOC~~
30.0000 mL | Freq: Once | ORAL | Status: AC
  Administered 2017-05-09: 30 mL via ORAL
  Filled 2017-05-09: qty 30

## 2017-05-09 MED ORDER — MORPHINE SULFATE (PF) 4 MG/ML IV SOLN
4.0000 mg | Freq: Once | INTRAVENOUS | Status: AC
  Administered 2017-05-09: 4 mg via INTRAVENOUS
  Filled 2017-05-09: qty 1

## 2017-05-09 MED ORDER — PANTOPRAZOLE SODIUM 40 MG IV SOLR
40.0000 mg | Freq: Once | INTRAVENOUS | Status: AC
  Administered 2017-05-09: 40 mg via INTRAVENOUS
  Filled 2017-05-09: qty 40

## 2017-05-09 NOTE — ED Provider Notes (Signed)
[facility] Provider Note   CSN: [id number] Arrival date & time: 05/09/17  1027   By signing my name below, I, Bobbie Stack, attest that this documentation has been prepared under the direction and in the presence of Verdie Mosher, Neysa Bonito, MD. Electronically Signed: Bobbie Stack, Scribe. 05/09/17. 11:17 AM. History   Chief Complaint Chief Complaint  Patient presents with  . Chest Pain    The history is provided by the patient. No language interpreter was used.  HPI Comments: Julie [name] is a 71 y.o. female who presents to the Emergency Department complaining of centralized burning chest pain for the past 3 to 4 days. She states that the pain began in her chest and radiates to her epigastric area. She rates her pain 8/10 currently. She describes her pain as a "burning" sensation. Nothing makes her pain worse. She has tried taking tums, pepcid, protonix today with no relief. Pain constant. Does not worsen with exertion or activity. Not make worse by foods, but she has had  difficulty swallowing food recently. She states that when she tried to swallow food "it feels as though it may get stuck" and she would drink fluids to wash it down. She states that she doesn't currently have a GI doctor but has seen Gi before for similar symptoms and an esophageal stricture that has required dilation. She hasn't had an endoscopy in the past few years. She denies any vomiting, diarrhea, difficulty breathing, syncope, fevers, or coughing recently.  Past Medical History:  Diagnosis Date  . Depression   . GERD (gastroesophageal reflux disease)   . Headache   . Hypercholesteremia     Patient Active Problem List   Diagnosis Date Noted  . Respiratory failure (HCC)   . Lumbar spine scoliosis 01/21/2017    Past Surgical History:  Procedure Laterality Date  . ABDOMINAL HYSTERECTOMY    . ANTERIOR LAT LUMBAR FUSION Right 01/21/2017   Procedure: RIGHT LUMBAR THREE-FOUR LUMBAR FOUR - FIVE  ANTEROLATERAL LUMBAR INTERBODY FUSION WITH PERCUTANEOUS PEDICLE SCREWS;  Surgeon: Maeola Harman, MD;  Location: [facility] OR;  Service: Neurosurgery;  Laterality: Right;  RIGHT L3-4 L4-5 ANTEROLATERAL LUMBAR INTERBODY FUSION WITH PERCUTANEOUS PEDICLE SCREWS  . BACK SURGERY    . BALLOON DILATION N/A 04/08/2014   Procedure: BALLOON DILATION;  Surgeon: Malissa Hippo, MD;  Location: [facility] OR[facility];  Service: Endoscopy;  Laterality: N/A;  . BLADDER SUSPENSION  2013   [facility]  . CHOLECYSTECTOMY    . ERCP N/A 04/08/2014   Procedure: ENDOSCOPIC RETROGRADE CHOLANGIOPANCREATOGRAPHY (ERCP);  Surgeon: Malissa Hippo, MD;  Location: [facility];  Service: Endoscopy;  Laterality: N/A;  . LUMBAR PERCUTANEOUS PEDICLE SCREW 2 LEVEL Right 01/21/2017   Procedure: LUMBAR PERCUTANEOUS PEDICLE SCREW 2 LEVEL;  Surgeon: Maeola Harman, MD;  Location: [facility];  Service: Neurosurgery;  Laterality: Right;  . NASAL SEPTUM SURGERY  2013   [facility]  . PANCREATIC STENT PLACEMENT N/A 04/08/2014   Procedure: PANCREATIC STENT PLACEMENT;  Surgeon: Malissa Hippo, MD;  Location: [facility] ORS;  Service: Endoscopy;  Laterality: N/A;  . SPHINCTEROTOMY N/A 04/08/2014   Procedure: BILIARY SPHINCTEROTOMY;  Surgeon: Malissa Hippo, MD;  Location: [facility] ORS;  Service: Endoscopy;  Laterality: N/A;    OB History    No data available       Home Medications    Prior to Admission medications   Medication Sig Start Date End Date Taking? Authorizing Provider  acetaminophen (TYLENOL) 500 MG tablet Take 1,000 mg by mouth every 6 (  six) hours as needed for mild pain or moderate pain.   Yes [provider]  ALPRAZolam Prudy Feeler(XANAX) 0.5 MG tablet Take 0.5 mg by mouth daily as needed for anxiety.    Yes [provider]  atorvastatin (LIPITOR) 20 MG tablet Take 20 mg by mouth at bedtime.    Yes [provider]  escitalopram (LEXAPRO) 10 MG tablet Take 10 mg by mouth daily.   Yes [provider]  estradiol (ESTRACE) 0.5 MG  tablet Take 0.5 mg by mouth every other day.   Yes [provider]  fluticasone (FLONASE) 50 MCG/ACT nasal spray Place 1 spray into both nostrils daily as needed for allergies or rhinitis.   Yes [provider]  gabapentin (NEURONTIN) 300 MG capsule Take 1 capsule by mouth 4 (four) times daily. 04/18/17  Yes [provider]  methocarbamol (ROBAXIN) 500 MG tablet Take 1 tablet by mouth 3 (three) times daily. 04/22/17  Yes [provider]  oxyCODONE-acetaminophen (PERCOCET) 7.5-325 MG tablet Take 1 tablet by mouth every 6 (six) hours as needed for pain. 04/16/17  Yes [provider]  pantoprazole (PROTONIX) 40 MG tablet Take 40 mg by mouth daily.   Yes [provider]  ranitidine (ZANTAC) 150 MG tablet Take 150 mg by mouth 2 (two) times daily.   Yes [provider]  traZODone (DESYREL) 100 MG tablet Take 100 mg by mouth 3 (three) times daily.    Yes [provider]    Family History History reviewed. No pertinent family history.  Social History Social History  Substance Use Topics  . Smoking status: Never Smoker  . Smokeless tobacco: Never Used  . Alcohol use No     Allergies   No known allergies   Review of Systems Review of Systems  Constitutional: Negative for fever.  HENT: Positive for trouble swallowing.   Respiratory: Negative for cough and shortness of breath.   Cardiovascular: Positive for chest pain.  Gastrointestinal: Positive for abdominal pain. Negative for diarrhea, nausea and vomiting.  Neurological: Positive for headaches. Negative for syncope.  All other systems reviewed and are negative.   Physical Exam Updated Vital Signs BP (!) 160/75   Pulse (!) 48   Temp 97.9 F (36.6 C) (Oral)   Resp (!) 9   Ht 5\' 1"  (1.549 m)   Wt 138 lb (62.6 kg)   SpO2 95%   BMI 26.07 kg/m   Physical Exam Physical Exam  Nursing note and vitals reviewed. Constitutional: Well developed, well nourished,  non-toxic, and in no acute distress Head: Normocephalic and atraumatic.  Mouth/Throat: Oropharynx is clear and moist.  Neck: Normal range of motion. Neck supple.  Cardiovascular: Normal rate and regular rhythm.   Pulmonary/Chest: Effort normal and breath sounds normal.  Abdominal: Soft. There is no tenderness. There is no rebound and no guarding.  Musculoskeletal: Normal range of motion.  Neurological: Alert, no facial droop, fluent speech, moves all extremities symmetrically Skin: Skin is warm and dry.  Psychiatric: Cooperative   ED Treatments / Results  DIAGNOSTIC STUDIES: Oxygen Saturation is 97% on RA, normal by my interpretation.    COORDINATION OF CARE: 11:10 AM Discussed treatment plan with pt at bedside and pt agreed to plan. I will check her labs and do a full cardiac work-up for the patient.  Labs (all labs ordered are listed, but only abnormal results are displayed) Labs Reviewed  BASIC METABOLIC PANEL - Abnormal; Notable for the following:       Result Value  Glucose, Bld 100 (*)    All other components within normal limits  HEPATIC FUNCTION PANEL - Abnormal; Notable for the following:    ALT 13 (*)    Total Bilirubin 0.2 (*)    Bilirubin, Direct <0.1 (*)    All other components within normal limits  CBC  TROPONIN I  LIPASE, BLOOD  DIFFERENTIAL  I-STAT TROPOININ, ED    EKG  EKG Interpretation  Date/Time:  Friday May 09 2017 14:20:50 EDT Ventricular Rate:  64 PR Interval:    QRS Duration: 121 QT Interval:  499 QTC Calculation: 515 R Axis:   -42 Text Interpretation:  Sinus rhythm RBBB and LAFB Left ventricular hypertrophy No significant change since last tracing Confirmed by Waddell Iten MD, Henretter Piekarski [phone]) on 05/09/2017 3:10:20 PM       Radiology Dg Chest 2 View  Result Date: 05/09/2017 CLINICAL DATA:  71 year old female with chest pain and burning radiating to the upper abdomen for 3 days. EXAM: CHEST  2 VIEW COMPARISON:  Portable chest 01/24/2017 and earlier.  FINDINGS: Stable cardiac size at the upper limits of normal. Other mediastinal contours are within normal limits. Visualized tracheal air column is within normal limits. Improved bibasilar ventilation since January. Mild asymmetric increased reticulonodular interstitial opacity today in the right mid and lower lung. No pneumothorax, pulmonary edema, pleural effusion or confluent pulmonary opacity. No acute osseous abnormality identified. Partially visible lower lumbar spinal fusion hardware. Stable cholecystectomy clips. Negative visible bowel gas pattern. IMPRESSION: 1. Improved lung base ventilation since January but mild asymmetric right lung reticulonodular interstitial opacity today. Favor viral/atypical respiratory infection over asymmetric interstitial edema. No pleural effusion. 2. No other acute cardiopulmonary abnormality. Electronically Signed   By: Odessa Fleming M.D.   On: 05/09/2017 12:04    Procedures Procedures (including critical care time)  Medications Ordered in ED Medications  gi cocktail (Maalox,Lidocaine,Donnatal) (30 mLs Oral Given 05/09/17 1236)  pantoprazole (PROTONIX) injection 40 mg (40 mg Intravenous Given 05/09/17 1312)  morphine 4 MG/ML injection 4 mg (4 mg Intravenous Given 05/09/17 1312)     Initial Impression / Assessment and Plan / ED Course  I have reviewed the triage vital signs and the nursing notes.  Pertinent labs & imaging results that were available during my care of the patient were reviewed by me and considered in my medical decision making (see chart for details).     Presenting with constant burning chest pain for 4 days. She has had issues with reflux and esophageal strictures before, and her symptoms of difficulty swallowing at times recently makes this seem more like an esophageal issue rather than a cardiopulmonary issue. She is very well appearing, in no distress. With soft and benign abdomen. Seems unlikely ACS as constant pain for 4 days, not associated  with exertion or activity. Serial troponins are normal. EKG is nonischemic. CXR visualized and shows no acute cardiopulmonary processes. Symptoms improved after analgesics. She is given follow-up GI for endoscopy and ongoing management.   Final Clinical Impressions(s) / ED Diagnoses   Final diagnoses:  Gastroesophageal reflux disease with esophagitis  Atypical chest pain    New Prescriptions New Prescriptions   No medications on file   I personally performed the services described in this documentation, which was scribed in my presence. The recorded information has been reviewed and is accurate.    Lavera Guise, MD 05/09/17 [phone]

## 2017-05-09 NOTE — ED Triage Notes (Signed)
Patient complaining of chest "burning" from epigastric area down to upper abdomen x 3 days. Denies any other symptoms.

## 2017-05-09 NOTE — Discharge Instructions (Signed)
Your chest pain work-up is reassuring.   Your chest pain is felt to be more related to esophagitis and you likely have a stricture that has been making your symptoms worse.   Please follow-up closely with a GI doctor, listed on this paperwork  Return without fail for worsening symptoms, including worsening pain, difficulty breathing, intractable vomiting or any other symptoms concerning to you.

## 2017-05-21 ENCOUNTER — Encounter (INDEPENDENT_AMBULATORY_CARE_PROVIDER_SITE_OTHER): Payer: Self-pay | Admitting: Internal Medicine

## 2017-06-04 ENCOUNTER — Encounter (INDEPENDENT_AMBULATORY_CARE_PROVIDER_SITE_OTHER): Payer: Self-pay | Admitting: Internal Medicine

## 2017-06-04 ENCOUNTER — Ambulatory Visit (INDEPENDENT_AMBULATORY_CARE_PROVIDER_SITE_OTHER): Payer: Medicare Other | Admitting: Internal Medicine

## 2017-06-16 ENCOUNTER — Encounter (INDEPENDENT_AMBULATORY_CARE_PROVIDER_SITE_OTHER): Payer: Self-pay | Admitting: Internal Medicine

## 2017-06-16 ENCOUNTER — Ambulatory Visit (INDEPENDENT_AMBULATORY_CARE_PROVIDER_SITE_OTHER): Payer: Medicare Other | Admitting: Internal Medicine

## 2017-06-16 DIAGNOSIS — R1310 Dysphagia, unspecified: Secondary | ICD-10-CM

## 2017-06-16 DIAGNOSIS — R1319 Other dysphagia: Secondary | ICD-10-CM

## 2017-06-16 HISTORY — DX: Dysphagia, unspecified: R13.10

## 2017-06-16 NOTE — Patient Instructions (Addendum)
DG Esophagram. Continue the Carafate.

## 2017-06-16 NOTE — Progress Notes (Addendum)
Subjective:    Patient ID: Julie [name], female    DOB: [birth year], 71 y.o.   MRN: [id number]  HPI Referred by Dr.Caplan for dysphagia/heartburn.  She says she had an EGD/ED by Dr. Samuella CotaPandya last year. I will get those records. She did not want to go back to their office.  She tells me today she is having dysphagia. She has actually coughed her bolus up. She says pills are lodging in her esophagus. She has burning in her esophagus. She says the Carafate is helping with the burning.  She takes Carafate QID Seen in the ED last month for burning in her esophagus.  Cardiac work up was negative. Has a BM daily sometimes twice a day. No melena or BRRB.  States has undergone EGD/EDs about 6 times in the past.  No NSAIDS.    05/14/2017 H and H 13.6 and 42.9    EGD/ED was in March of 2013. Dr. Aleene DavidsonSpainhour.     Findings 8 sm hiatus hernia. Lower esohpageal ring. No Barrett's.  Dilated with 52 Maloney bougie. Normal gastric mucosa.   06/05/2016 EGD/ED: Hiatal hernia. Tongues of red mucosa above GE Junction. NO obvious changes of Barrett's seen.  Esophageal stricture.  Dr. Samuella CotaPandya. Biopsy: Chronic cardia gastritis and esophagitis.   04/08/2014 ERCP with Biliary sphincterotomy and pancreatic stenting cholelithiasis and dilated CBD.   Review of Systems Past Medical History:  Diagnosis Date  . Depression   . Dysphagia 06/16/2017  . GERD (gastroesophageal reflux disease)   . Headache   . Hypercholesteremia     Past Surgical History:  Procedure Laterality Date  . ABDOMINAL HYSTERECTOMY    . ANTERIOR LAT LUMBAR FUSION Right 01/21/2017   Procedure: RIGHT LUMBAR THREE-FOUR LUMBAR FOUR - FIVE ANTEROLATERAL LUMBAR INTERBODY FUSION WITH PERCUTANEOUS PEDICLE SCREWS;  Surgeon: Maeola HarmanJoseph Stern, MD;  Location: [facility] OR;  Service: Neurosurgery;  Laterality: Right;  RIGHT L3-4 L4-5 ANTEROLATERAL LUMBAR INTERBODY FUSION WITH PERCUTANEOUS PEDICLE SCREWS  . BACK SURGERY    . BALLOON DILATION N/A 04/08/2014   Procedure:  BALLOON DILATION;  Surgeon: Malissa HippoNajeeb U Rehman, MD;  Location: [facility] OR[facility];  Service: Endoscopy;  Laterality: N/A;  . BLADDER SUSPENSION  2013   [facility]  . CHOLECYSTECTOMY    . ERCP N/A 04/08/2014   Procedure: ENDOSCOPIC RETROGRADE CHOLANGIOPANCREATOGRAPHY (ERCP);  Surgeon: Malissa HippoNajeeb U Rehman, MD;  Location: [facility];  Service: Endoscopy;  Laterality: N/A;  . LUMBAR PERCUTANEOUS PEDICLE SCREW 2 LEVEL Right 01/21/2017   Procedure: LUMBAR PERCUTANEOUS PEDICLE SCREW 2 LEVEL;  Surgeon: Maeola HarmanJoseph Stern, MD;  [facility];  Service: Neurosurgery;  Laterality: Right;  . [facility]  [facility]  . PANCREATIC STENT PLACEMENT N/A 04/08/2014   Procedure: PANCREATIC STENT PLACEMENT;  Surgeon: Malissa HippoNajeeb U Rehman, MD;  Location: [facility] OR[facility];  Service: Endoscopy;  Laterality: N/A;  . SPHINCTEROTOMY N/A 04/08/2014   Procedure: BILIARY SPHINCTEROTOMY;  Surgeon: Malissa HippoNajeeb U Rehman, MD;  Location: [facility] OR[facility];  Service: Endoscopy;  Laterality: N/A;    Allergies  Allergen Reactions  . No Known Allergies     Current Outpatient Prescriptions on File Prior to Visit  Medication Sig Dispense Refill  . acetaminophen (TYLENOL) 500 MG tablet Take 1,000 mg by mouth every 6 (six) hours as needed for mild pain or moderate pain.    Marland Kitchen. ALPRAZolam (XANAX) 0.5 MG tablet Take 0.5 mg by mouth daily as needed for anxiety.     Marland Kitchen. atorvastatin (LIPITOR) 20 MG tablet Take 20 mg by mouth at  bedtime.     Marland Kitchen escitalopram (LEXAPRO) 10 MG tablet Take 10 mg by mouth daily.    Marland Kitchen estradiol (ESTRACE) 0.5 MG tablet Take 0.5 mg by mouth every other day.    . gabapentin (NEURONTIN) 300 MG capsule Take 1 capsule by mouth 4 (four) times daily.    . methocarbamol (ROBAXIN) 500 MG tablet Take 1 tablet by mouth 3 (three) times daily.    Marland Kitchen oxyCODONE-acetaminophen (PERCOCET) 7.5-325 MG tablet Take 1 tablet by mouth every 6 (six) hours as needed for pain.    . pantoprazole (PROTONIX) 40 MG tablet Take 40 mg by mouth daily.    . ranitidine (ZANTAC)  150 MG tablet Take 150 mg by mouth every morning.     . traZODone (DESYREL) 100 MG tablet Take 100 mg by mouth 2 (two) times daily.      No current facility-administered medications on file prior to visit.         Objective:   Physical Exam Blood pressure 140/78, pulse 64, temperature 97.7 F (36.5 C), height 5\' 1"  (1.549 m), weight 142 lb 12.8 oz (64.8 kg).  Alert and oriented. Skin warm and dry. Oral mucosa is moist.   . Sclera anicteric, conjunctivae is pink. Thyroid not enlarged. No cervical lymphadenopathy. Lungs clear. Heart regular rate and rhythm.  Abdomen is soft. Bowel sounds are positive. No hepatomegaly. No abdominal masses felt. No tenderness.  No edema to lower extremities.          Assessment & Plan:  DG esophagram. Further recommendations to follow. Continue the Carafate.  IF  she needs an EGD/ED will need to be with propofol.  Will get EGD/ED from Dr. Samuella Cota

## 2017-06-27 ENCOUNTER — Ambulatory Visit (HOSPITAL_COMMUNITY): Payer: Medicare Other

## 2017-06-27 ENCOUNTER — Ambulatory Visit (HOSPITAL_COMMUNITY)
Admission: RE | Admit: 2017-06-27 | Discharge: 2017-06-27 | Disposition: A | Discharge status: Home / Self Care | Payer: Medicare Other | Source: Ambulatory Visit | Attending: Internal Medicine | Admitting: Internal Medicine

## 2017-06-27 DIAGNOSIS — K449 Diaphragmatic hernia without obstruction or gangrene: Secondary | ICD-10-CM | POA: Insufficient documentation

## 2017-06-27 DIAGNOSIS — R1310 Dysphagia, unspecified: Secondary | ICD-10-CM | POA: Insufficient documentation

## 2017-06-27 DIAGNOSIS — R1319 Other dysphagia: Secondary | ICD-10-CM

## 2017-06-27 DIAGNOSIS — K222 Esophageal obstruction: Secondary | ICD-10-CM | POA: Diagnosis not present

## 2017-07-03 ENCOUNTER — Encounter (INDEPENDENT_AMBULATORY_CARE_PROVIDER_SITE_OTHER): Payer: Self-pay | Admitting: Internal Medicine

## 2017-07-03 NOTE — Progress Notes (Signed)
Patient was given an appointment for 09/08/17 at 1:45pm.  A letter was mailed to the patient.

## 2017-07-04 ENCOUNTER — Encounter (INDEPENDENT_AMBULATORY_CARE_PROVIDER_SITE_OTHER): Payer: Self-pay

## 2017-09-08 ENCOUNTER — Ambulatory Visit (INDEPENDENT_AMBULATORY_CARE_PROVIDER_SITE_OTHER): Payer: Medicare Other | Admitting: Internal Medicine

## 2017-09-18 ENCOUNTER — Ambulatory Visit (INDEPENDENT_AMBULATORY_CARE_PROVIDER_SITE_OTHER): Payer: Medicare Other | Admitting: Internal Medicine

## 2017-10-22 IMAGING — RF DG LUMBAR SPINE 2-3V
1 series · 2 of 2 positions shown · non-contrast
Comparison: None.

CLINICAL DATA: L3-4, L4-5 fusion

EXAM:
DG C-ARM GT 120 MIN; LUMBAR SPINE - 2-3 VIEW

[Series 1: run · 2 of 2 slices shown]
[im 1/2]
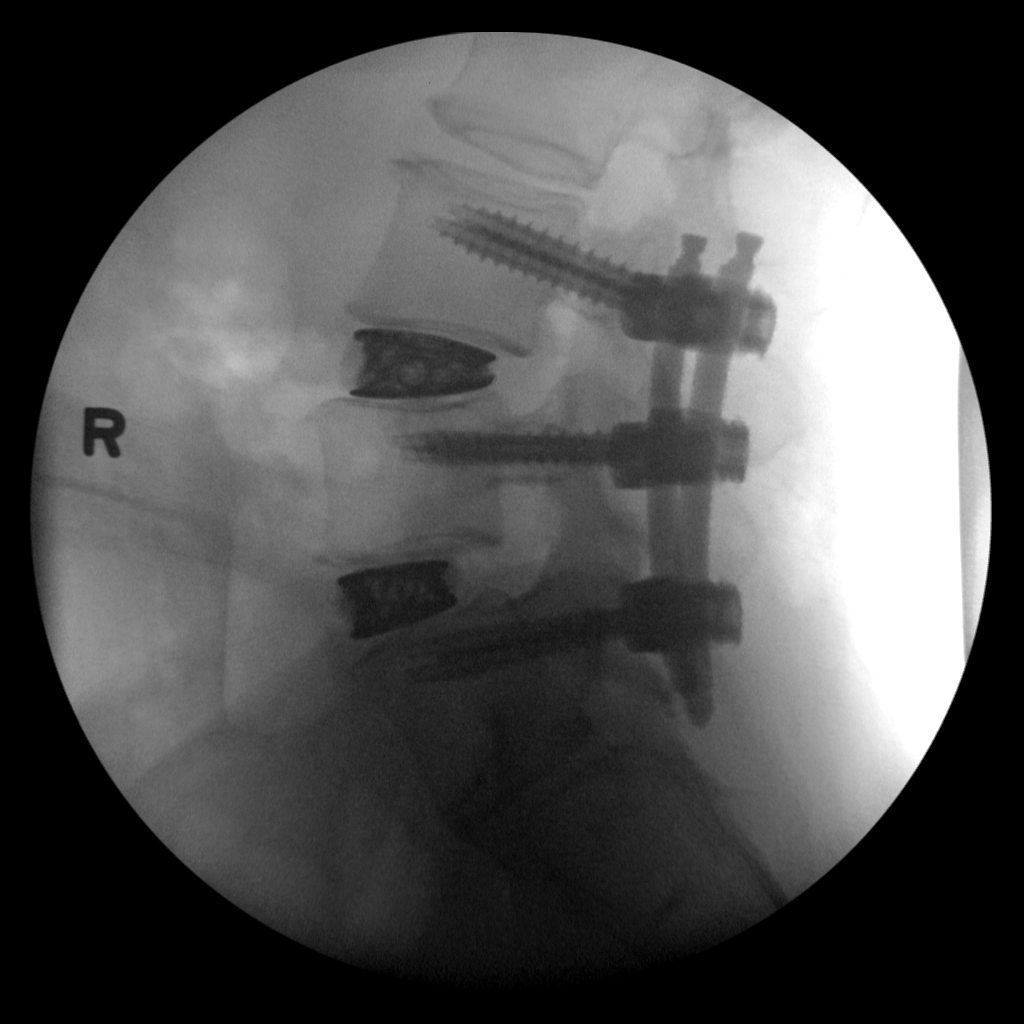
[im 2/2]
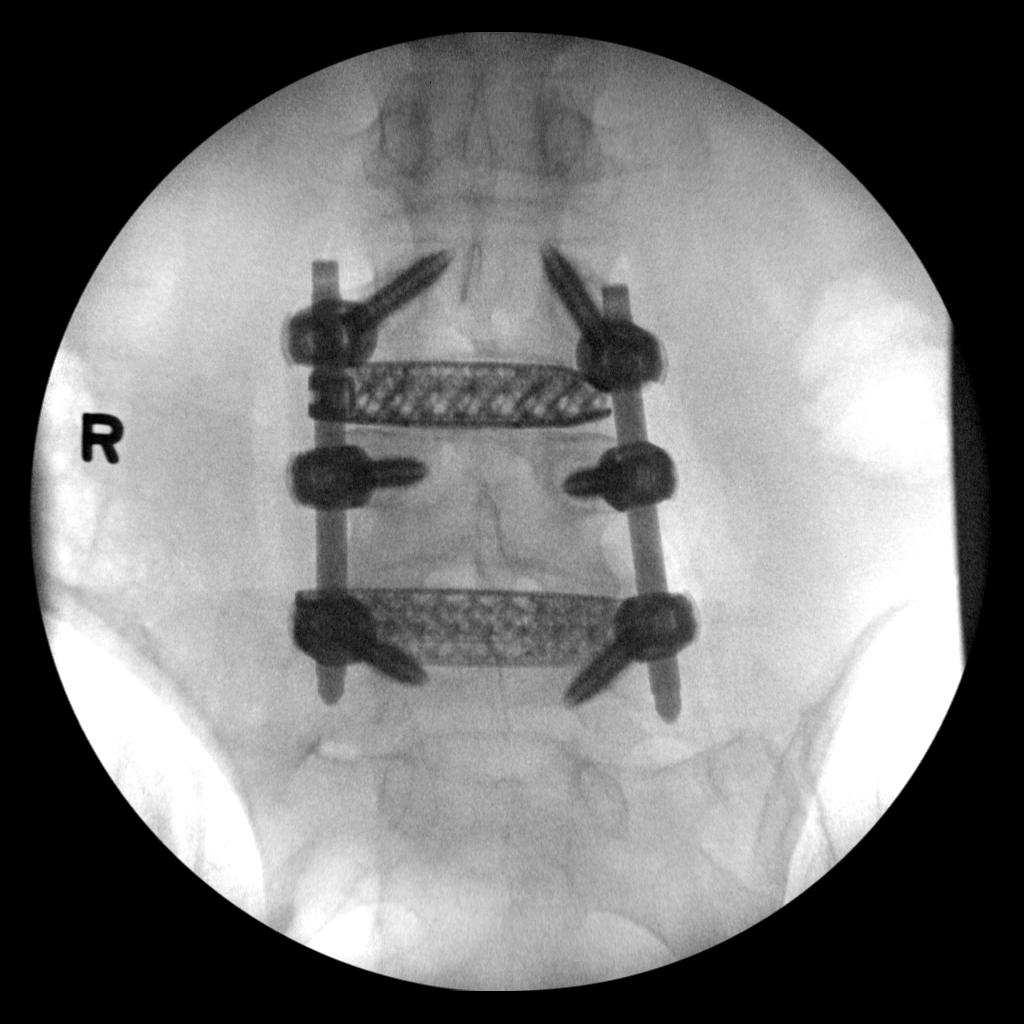

[2 of 2 positions shown; findings below may reference images not displayed]

FINDINGS: Two intraoperative spot images demonstrate changes of posterior
fusion at L3-4 L4-5. Normal alignment. No hardware complicating
feature.
IMPRESSION: Posterior fusion.  No visible complicating feature.

## 2017-10-24 IMAGING — CR DG CHEST 1V PORT
1 series · 1 of 1 positions shown · non-contrast
Comparison: None.

CLINICAL DATA: Chest congestion, shortness of Breath

EXAM:
PORTABLE CHEST 1 VIEW

[portable]
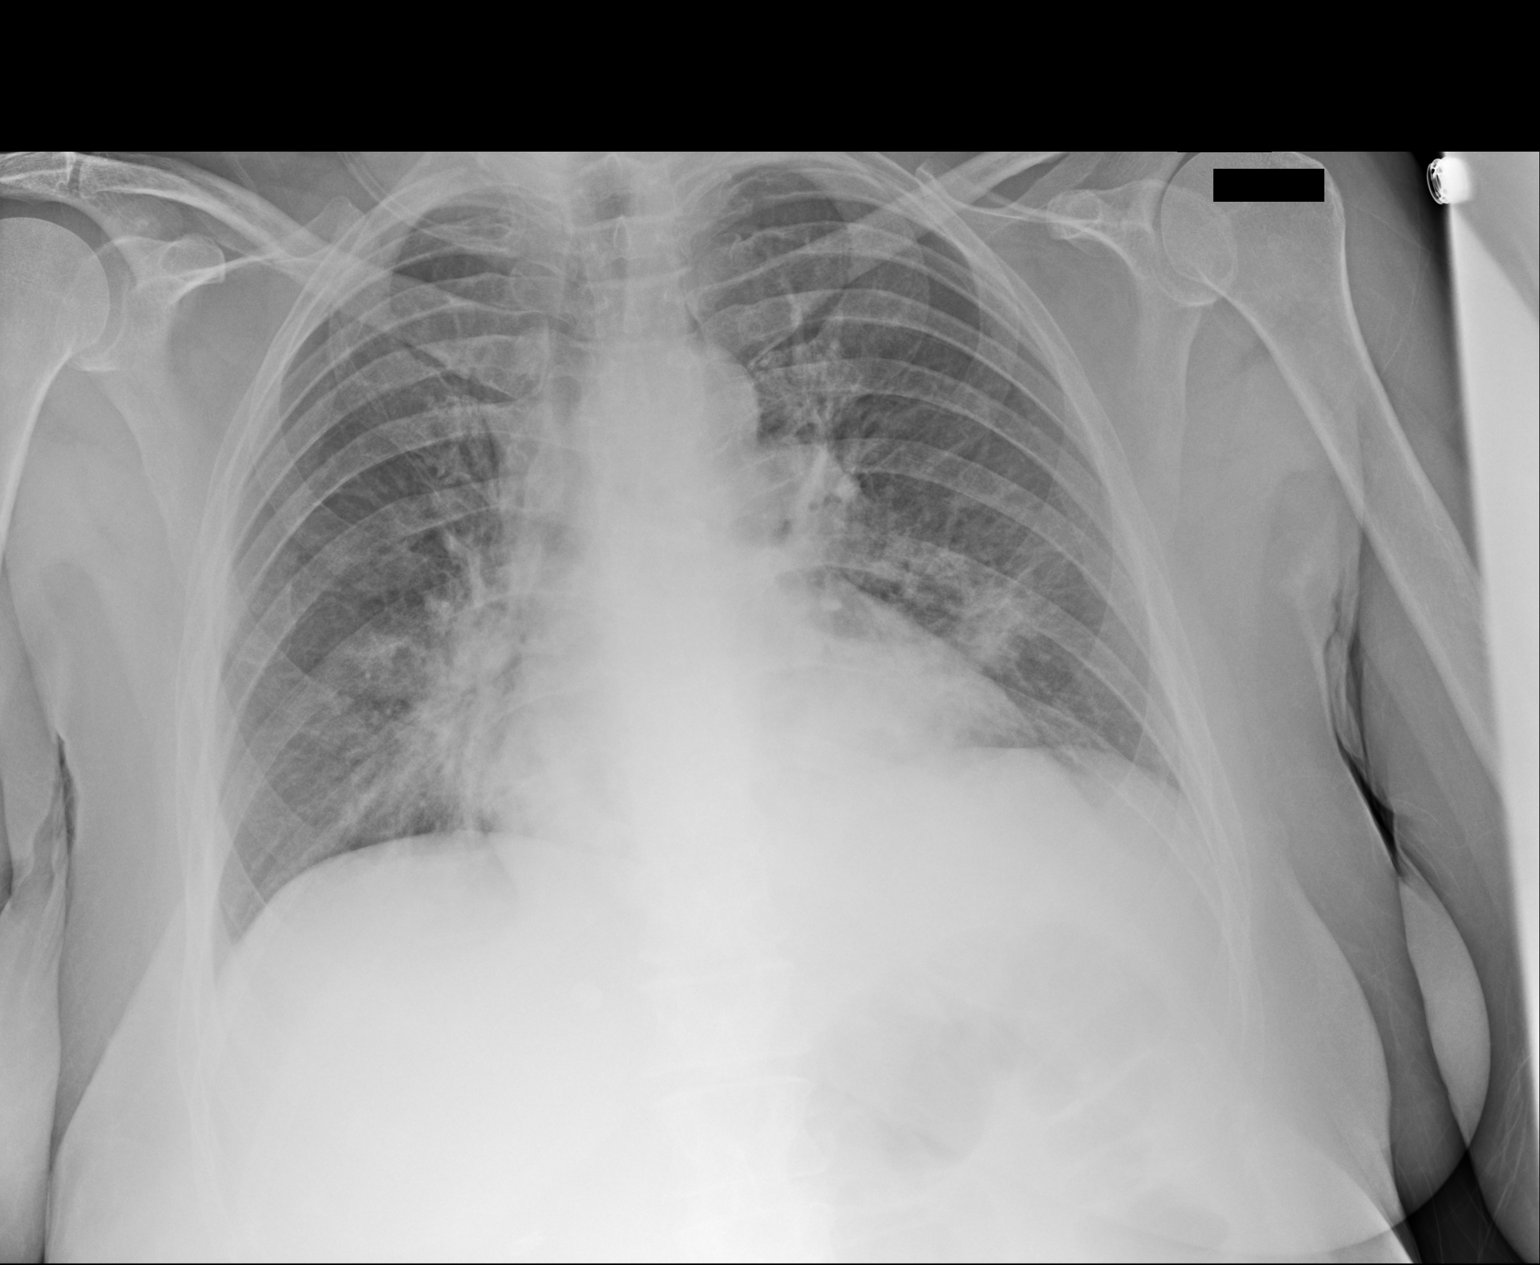

[1 of 1 positions shown; findings below may reference images not displayed]

FINDINGS: Heart is borderline in size. Perihilar and lower lobe opacities are
noted which could reflect mild edema, less likely infection. No
effusions or acute bony abnormality.
IMPRESSION: Borderline heart size with patchy perihilar and lower lobe
opacities, favor edema/ CHF.

## 2018-04-22 ENCOUNTER — Other Ambulatory Visit: Payer: Self-pay

## 2018-04-22 ENCOUNTER — Encounter (HOSPITAL_COMMUNITY): Payer: Self-pay

## 2018-04-22 ENCOUNTER — Emergency Department (HOSPITAL_COMMUNITY)
Admission: EM | Admit: 2018-04-22 | Discharge: 2018-04-22 | Disposition: A | Discharge status: Home / Self Care | Payer: Medicare Other | Attending: Emergency Medicine | Admitting: Emergency Medicine

## 2018-04-22 ENCOUNTER — Emergency Department (HOSPITAL_COMMUNITY): Payer: Medicare Other

## 2018-04-22 DIAGNOSIS — R109 Unspecified abdominal pain: Secondary | ICD-10-CM

## 2018-04-22 DIAGNOSIS — Z9049 Acquired absence of other specified parts of digestive tract: Secondary | ICD-10-CM | POA: Insufficient documentation

## 2018-04-22 DIAGNOSIS — R1031 Right lower quadrant pain: Secondary | ICD-10-CM | POA: Diagnosis not present

## 2018-04-22 DIAGNOSIS — F329 Major depressive disorder, single episode, unspecified: Secondary | ICD-10-CM | POA: Insufficient documentation

## 2018-04-22 DIAGNOSIS — M25551 Pain in right hip: Secondary | ICD-10-CM | POA: Diagnosis not present

## 2018-04-22 DIAGNOSIS — M545 Low back pain: Secondary | ICD-10-CM | POA: Diagnosis present

## 2018-04-22 LAB — URINALYSIS, ROUTINE W REFLEX MICROSCOPIC
Bacteria, UA: NONE SEEN
Bilirubin Urine: NEGATIVE
Glucose, UA: NEGATIVE mg/dL
Hgb urine dipstick: NEGATIVE
Ketones, ur: NEGATIVE mg/dL
Nitrite: NEGATIVE
Protein, ur: NEGATIVE mg/dL
Specific Gravity, Urine: 1.029 (ref 1.005–1.030)
pH: 5 (ref 5.0–8.0)

## 2018-04-22 LAB — CBC WITH DIFFERENTIAL/PLATELET
Basophils Absolute: 0 10*3/uL (ref 0.0–0.1)
Basophils Relative: 0 %
Eosinophils Absolute: 0 10*3/uL (ref 0.0–0.7)
Eosinophils Relative: 1 %
HCT: 40.6 % (ref 36.0–46.0)
Hemoglobin: 12.7 g/dL (ref 12.0–15.0)
Lymphocytes Relative: 23 %
Lymphs Abs: 1.8 10*3/uL (ref 0.7–4.0)
MCH: 30.8 pg (ref 26.0–34.0)
MCHC: 31.3 g/dL (ref 30.0–36.0)
MCV: 98.5 fL (ref 78.0–100.0)
Monocytes Absolute: 0.7 10*3/uL (ref 0.1–1.0)
Monocytes Relative: 9 %
Neutro Abs: 5.2 10*3/uL (ref 1.7–7.7)
Neutrophils Relative %: 67 %
Platelets: 215 10*3/uL (ref 150–400)
RBC: 4.12 MIL/uL (ref 3.87–5.11)
RDW: 12.7 % (ref 11.5–15.5)
WBC: 7.6 10*3/uL (ref 4.0–10.5)

## 2018-04-22 LAB — COMPREHENSIVE METABOLIC PANEL
ALT: 23 U/L (ref 14–54)
AST: 19 U/L (ref 15–41)
Albumin: 3.5 g/dL (ref 3.5–5.0)
Alkaline Phosphatase: 89 U/L (ref 38–126)
Anion gap: 9 (ref 5–15)
BUN: 15 mg/dL (ref 6–20)
CO2: 24 mmol/L (ref 22–32)
Calcium: 8.6 mg/dL — ABNORMAL LOW (ref 8.9–10.3)
Chloride: 104 mmol/L (ref 101–111)
Creatinine, Ser: 0.67 mg/dL (ref 0.44–1.00)
GFR calc Af Amer: >60 mL/min (ref >60)
GFR calc non Af Amer: >60 mL/min (ref >60)
Glucose, Bld: 110 mg/dL — ABNORMAL HIGH (ref 65–99)
Potassium: 3.7 mmol/L (ref 3.5–5.1)
Sodium: 137 mmol/L (ref 135–145)
Total Bilirubin: 0.4 mg/dL (ref 0.3–1.2)
Total Protein: 6.1 g/dL — ABNORMAL LOW (ref 6.5–8.1)

## 2018-04-22 MED ORDER — LIDOCAINE 5 % EX PTCH
1.0000 | MEDICATED_PATCH | CUTANEOUS | Status: DC
  Administered 2018-04-22: 1 via TRANSDERMAL
  Filled 2018-04-22 (×2): qty 1

## 2018-04-22 MED ORDER — LIDOCAINE 5 % EX PTCH: 1.0000 | MEDICATED_PATCH | CUTANEOUS | 0 refills | Status: DC

## 2018-04-22 MED ORDER — PREDNISONE 20 MG PO TABS: 20.0000 mg | ORAL_TABLET | Freq: Every day | ORAL | 0 refills | Status: AC

## 2018-04-22 MED ORDER — OXYCODONE HCL 5 MG PO TABS: 5.0000 mg | ORAL_TABLET | Freq: Four times a day (QID) | ORAL | 0 refills | Status: DC | PRN

## 2018-04-22 MED ORDER — KETOROLAC TROMETHAMINE 30 MG/ML IJ SOLN
30.0000 mg | Freq: Once | INTRAMUSCULAR | Status: AC
  Administered 2018-04-22: 30 mg via INTRAVENOUS
  Filled 2018-04-22: qty 1

## 2018-04-22 NOTE — ED Provider Notes (Addendum)
Emergency Department Provider Note   I have reviewed the triage vital signs and the nursing notes.   HISTORY  Chief Complaint Flank Pain   HPI Julie [name] is a 71 y.o. female with PMH of GERD, HLD, and chronic lower back pain presents to the emergency department for evaluation of right lower back pain.  The patient has had lumbar spine surgery approximately 2 years prior.  Since that time she has had some mild right lower back pain but this worsened significantly last night the patient was in the shower.  No obvious inciting event.  She did have a fall approximately a week ago but no worsening pain after that event.  She has some mild radiation to the right hip.  Denies any radiation of pain down the right leg.  No numbness or tingling.  No gait instability.  Denies any bowel or bladder incontinence.  No groin numbness.  She has been afebrile.  No dysuria, hesitancy, urgency.  No hematuria.  Past Medical History:  Diagnosis Date  . Depression   . Dysphagia 06/16/2017  . GERD (gastroesophageal reflux disease)   . Headache   . Hypercholesteremia     Patient Active Problem List   Diagnosis Date Noted  . Dysphagia 06/16/2017  . Respiratory failure (HCC)   . Lumbar spine scoliosis 01/21/2017    Past Surgical History:  Procedure Laterality Date  . ABDOMINAL HYSTERECTOMY    . ANTERIOR LAT LUMBAR FUSION Right 01/21/2017   Procedure: RIGHT LUMBAR THREE-FOUR LUMBAR FOUR - FIVE ANTEROLATERAL LUMBAR INTERBODY FUSION WITH PERCUTANEOUS PEDICLE SCREWS;  Surgeon: Maeola Harman, MD;  Location: [facility];  Service: Neurosurgery;  Laterality: Right;  RIGHT L3-4 L4-5 ANTEROLATERAL LUMBAR INTERBODY FUSION WITH PERCUTANEOUS PEDICLE SCREWS  . BACK SURGERY    . BALLOON DILATION N/A 04/08/2014   Procedure: BALLOON DILATION;  Surgeon: Malissa Hippo, MD;  Location: [facility];  Service: Endoscopy;  Laterality: N/A;  . BLADDER SUSPENSION  2013   [facility]  . CHOLECYSTECTOMY    . ERCP N/A 04/08/2014    Procedure: ENDOSCOPIC RETROGRADE CHOLANGIOPANCREATOGRAPHY (ERCP);  Surgeon: Malissa Hippo, MD;  Location: [facility];  Service: Endoscopy;  Laterality: N/A;  . LUMBAR PERCUTANEOUS PEDICLE SCREW 2 LEVEL Right 01/21/2017   Procedure: LUMBAR PERCUTANEOUS PEDICLE SCREW 2 LEVEL;  Surgeon: Maeola Harman, MD;  Location: [facility] OR;  Service: Neurosurgery;  Laterality: Right;  . NASAL SEPTUM SURGERY  2013   [facility]  . PANCREATIC STENT PLACEMENT N/A 04/08/2014   Procedure: PANCREATIC STENT PLACEMENT;  Surgeon: Malissa Hippo, MD;  Location: [facility];  Service: Endoscopy;  Laterality: N/A;  . SPHINCTEROTOMY N/A 04/08/2014   Procedure: BILIARY SPHINCTEROTOMY;  Surgeon: Malissa Hippo, MD;  Location: [facility];  Service: Endoscopy;  Laterality: N/A;    Current Outpatient Rx  . Order #: [id number] Class: Historical Med  . Order #: [id number] Class: Historical Med  . Order #: [id number] Class: Historical Med  . Order #: [id number] Class: Historical Med  . Order #: [id number] Class: Historical Med  . Order #: [id number] Class: Historical Med  . Order #: [id number] Class: Historical Med  . Order #: [id number] Class: Historical Med  . Order #: [id number] Class: Print  . Order #: [id number] Class: Print  . Order #: [id number] Class: Print    Allergies No known allergies  No family history on file.  Social History Social History   Tobacco Use  . Smoking status: Never Smoker  . Smokeless tobacco: Never Used  Substance Use Topics  . Alcohol use: No  .  Drug use: No    Review of Systems  Constitutional: No fever/chills Eyes: No visual changes. ENT: No sore throat. Cardiovascular: Denies chest pain. Respiratory: Denies shortness of breath. Gastrointestinal: No abdominal pain. No nausea, no vomiting. No diarrhea. No constipation. Genitourinary: Negative for dysuria. Musculoskeletal: Positive for right lower back/flank pain. Skin: Negative for rash. Neurological: Negative for headaches, focal weakness or  numbness.  10-point ROS otherwise negative.  ____________________________________________   PHYSICAL EXAM:  VITAL SIGNS: ED Triage Vitals  Enc Vitals Group     BP 04/22/18 1505 (!) 157/74     Pulse Rate 04/22/18 1501 86     Resp 04/22/18 1501 16     Temp 04/22/18 1501 97.6 F (36.4 C)     Temp Source 04/22/18 1501 Oral     SpO2 04/22/18 1501 97 %     Weight 04/22/18 1502 136 lb (61.7 kg)     Pain Score 04/22/18 1502 8   Constitutional: Alert and oriented. Well appearing and in no acute distress. Eyes: Conjunctivae are normal.  Head: Atraumatic. Nose: No congestion/rhinnorhea. Mouth/Throat: Mucous membranes are moist. Neck: No stridor.  Cardiovascular: Normal rate, regular rhythm. Good peripheral circulation. Grossly normal heart sounds.   Respiratory: Normal respiratory effort.  No retractions. Lungs CTAB. Gastrointestinal: Soft with mild RLQ tenderness to palpation. No rebound or guarding. No distention.  Musculoskeletal: No lower extremity tenderness nor edema. No gross deformities of extremities. No midline thoracic or lumbar spine tenderness. Mild right CVA tenderness. Normal ROM of the right hip and knee.  Neurologic:  Normal speech and language. No gross focal neurologic deficits are appreciated.  Skin:  Skin is warm, dry and intact. No rash noted.  ____________________________________________   LABS (all labs ordered are listed, but only abnormal results are displayed)  Labs Reviewed  URINALYSIS, ROUTINE W REFLEX MICROSCOPIC - Abnormal; Notable for the following components:      Result Value   Leukocytes, UA TRACE (*)    All other components within normal limits  COMPREHENSIVE METABOLIC PANEL - Abnormal; Notable for the following components:   Glucose, Bld 110 (*)    Calcium 8.6 (*)    Total Protein 6.1 (*)    All other components within normal limits  CBC WITH DIFFERENTIAL/PLATELET   ____________________________________________  RADIOLOGY  Ct Renal  Stone Study  Result Date: 04/22/2018 CLINICAL DATA:  Right-sided flank pain EXAM: CT ABDOMEN AND PELVIS WITHOUT CONTRAST TECHNIQUE: Multidetector CT imaging of the abdomen and pelvis was performed following the standard protocol without IV contrast. COMPARISON:  10/22/2017 radiograph FINDINGS: Lower chest: Lung bases show no acute consolidation or effusion. Trace pericardial effusion. Heart size upper normal. Hepatobiliary: Calcified granuloma in the liver. Surgical clips at the gallbladder fossa. Enlarged extrahepatic bile duct up to 15 mm. Pancreas: Unremarkable. No pancreatic ductal dilatation or surrounding inflammatory changes. Spleen: Normal in size without focal abnormality. Adrenals/Urinary Tract: Adrenal glands are within normal limits. No hydronephrosis or ureteral stone. The bladder is unremarkable Stomach/Bowel: Stomach is within normal limits. Appendix appears normal. No evidence of bowel wall thickening, distention, or inflammatory changes. Sigmoid colon diverticular disease without acute inflammation Vascular/Lymphatic: Mild aortic atherosclerosis. No aneurysmal dilatation. No significantly enlarged lymph nodes Reproductive: Status post hysterectomy. No adnexal masses. Other: Negative for free air or free fluid Musculoskeletal: Posterior stabilization rods and fixating screws in the lumbar spine. No acute or suspicious abnormality IMPRESSION: 1. Negative for nephrolithiasis, hydronephrosis or ureteral stone 2. No CT evidence for acute intra-abdominal or pelvic abnormality 3. Sigmoid colon diverticular  disease without acute inflammation Electronically Signed   By: Jasmine PangKim  Fujinaga M.D.   On: 04/22/2018 17:06    ____________________________________________   PROCEDURES  Procedure(s) performed:   Procedures   ____________________________________________   INITIAL IMPRESSION / ASSESSMENT AND PLAN / ED COURSE  Pertinent labs & imaging results that were available during my care of the  patient were reviewed by me and considered in my medical decision making (see chart for details).  Patient presents to the ED for evaluation of right-sided back and flank pain worsening significantly last night.  Symptoms worse with movement.  No evidence to suggest infection in this area.  Patient has full range of motion of the right hip.  No midline spine tenderness.  No red flag signs or symptoms to suggest spinal cord emergency.  Plan for CT imaging of the abdomen and pelvis to rule out ureteral stone.  Labs and UA pending.  Treating pain with Toradol and Lidoderm patch  CT imaging reviewed with no acute findings. Labs unremarkable. Suspect MSK vs radicular pain. Patient to call Neurosurgery in the AM to schedule outpatient appointment. Patient feels that therapy is not helping with ortho treatment.   At this time, I do not feel there is any life-threatening condition present. I have reviewed and discussed all results (EKG, imaging, lab, urine as appropriate), exam findings with patient. I have reviewed nursing notes and appropriate previous records.  I feel the patient is safe to be discharged home without further emergent workup. Discussed usual and customary return precautions. Patient and family (if present) verbalize understanding and are comfortable with this plan.  Patient will follow-up with their primary care provider. If they do not have a primary care provider, information for follow-up has been provided to them. All questions have been answered.  ____________________________________________  FINAL CLINICAL IMPRESSION(S) / ED DIAGNOSES  Final diagnoses:  Right flank pain  Right hip pain     MEDICATIONS GIVEN DURING THIS VISIT:  Medications  ketorolac (TORADOL) 30 MG/ML injection 30 mg (30 mg Intravenous Given 04/22/18 1549)     NEW OUTPATIENT MEDICATIONS STARTED DURING THIS VISIT:  Discharge Medication List as of 04/22/2018  5:46 PM    START taking these medications    Details  lidocaine (LIDODERM) 5 % Place 1 patch onto the skin daily. Remove & Discard patch within 12 hours or as directed by MD, Starting Wed 04/22/2018, Print    oxyCODONE (ROXICODONE) 5 MG immediate release tablet Take 1 tablet (5 mg total) by mouth every 6 (six) hours as needed for severe pain., Starting Wed 04/22/2018, Print    predniSONE (DELTASONE) 20 MG tablet Take 1 tablet (20 mg total) by mouth daily for 5 days., Starting Wed 04/22/2018, Until Mon 04/27/2018, Print        Note:  This document was prepared using Dragon voice recognition software and may include unintentional dictation errors.  Alona BeneJoshua Celicia Minahan, MD Emergency Medicine    Timi Reeser, Arlyss RepressJoshua G, MD 04/23/18 1611    Maia PlanLong, Terance Pomplun G, MD 04/23/18 [phone]

## 2018-04-22 NOTE — Discharge Instructions (Signed)

## 2018-04-22 NOTE — ED Notes (Signed)
Pt taken to xray 

## 2018-04-22 NOTE — ED Triage Notes (Signed)
Pt report right side back/flank pain that has been hurting for awhile, but got worse last night. Hurts with movement. . Pt reports she has 2 screws in back

## 2020-04-11 ENCOUNTER — Ambulatory Visit (INDEPENDENT_AMBULATORY_CARE_PROVIDER_SITE_OTHER): Payer: Medicare Other | Admitting: Neurology

## 2020-04-11 ENCOUNTER — Encounter: Payer: Self-pay | Admitting: Neurology

## 2020-04-11 ENCOUNTER — Telehealth: Payer: Self-pay | Admitting: *Deleted

## 2020-04-11 ENCOUNTER — Other Ambulatory Visit: Payer: Self-pay

## 2020-04-11 VITALS — BP 159/75 | HR 57 | Temp 98.1°F | Ht 61.0 in | Wt 138.5 lb

## 2020-04-11 DIAGNOSIS — M5126 Other intervertebral disc displacement, lumbar region: Secondary | ICD-10-CM | POA: Diagnosis not present

## 2020-04-11 DIAGNOSIS — Z981 Arthrodesis status: Secondary | ICD-10-CM | POA: Diagnosis not present

## 2020-04-11 DIAGNOSIS — R200 Anesthesia of skin: Secondary | ICD-10-CM | POA: Insufficient documentation

## 2020-04-11 DIAGNOSIS — M25551 Pain in right hip: Secondary | ICD-10-CM | POA: Diagnosis not present

## 2020-04-11 MED ORDER — PREGABALIN 75 MG PO CAPS: ORAL_CAPSULE | ORAL | 5 refills | Status: DC

## 2020-04-11 NOTE — Telephone Encounter (Signed)
Request faxed to Dr. Orvan Falconer to request Cd [phone] # [phone]

## 2020-04-11 NOTE — Telephone Encounter (Signed)
Initiated PA Lyrica on CMM. Key:  Key: [id number] - PA Case ID: [id number] - Rx #: [id number]. In process of completing.

## 2020-04-11 NOTE — Progress Notes (Signed)
[facility]  PATIENT: Julie [name] DOB: 1946-01-04  REFERRING DOCTOR OR PCP: Romeo Rabon, MD SOURCE: Patient, notes from Dr. Arlyce Dice,  _________________________________   HISTORICAL  CHIEF COMPLAINT:  Chief Complaint  Patient presents with  . New Patient (Initial Visit)    RM 12, alone. Paper referral from Romeo Rabon, MD for neuropathy (pt did not know anything about neuropathy, was told she has not been dx with anything). Here to be evaluated for: pain in right hip, sciatic pain that radiates down her thigh into her knee. Has been following orthopaedics. This has been ongoing for the past two years. Dr. Jerl Santos (ortho) did surgery but it was ineffective. No recent falls per pt.     HISTORY OF PRESENT ILLNESS:  I had the pleasure seeing your patient, Julie [name], at [facility] for neurologic consultation regarding her pain.  She is a 74 year old woman who had left leg pain leading to L3-L5 posterior lumbar interbody fusion in 2018 (Dr. Venetia Maxon).  Of note, she has transitional anatomy.  A few weeks after surgery she began to have right leg pain that radiates from her hip to her knee.   Pain is worse with walking or chores (sweep, vacuum) or bending over.   Pain has a stabbing quality.    She has had therapy and epidural steroid injections without benefit.   She was evaluated for a spinal cord stimulator but did not have benefit from the temporary device.      She had been on gabapentin and meloxicam in the past without benefit.   She has not been on Lyrica or other AEDs.     MRI of the lumbar spine 01/13/2019 showed the postoperative changes but there did not appear to be any nerve root compression or spinal stenosis that could explain her symptoms.    She has seen Dr. Jerl Santos who did a hip operation on the right (on bursa?).   She noted no improvement.    I personally reviewed MRI of the lumbar spine 01/13/2019 and x-ray of the lumbar  spine 12/02/2018.  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  She has fusion at L3-L5 with posterior hardware and disc spacers in place.  There is disc protrusion at L2-L3 causing mild left lateral recess stenosis but no nerve root compression.  She did not appear to have epidural fibrosis.  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  At L3-L4 there is lateral recess stenosis impinging on the L4 nerve roots.  At L4-L5 there are degenerative changes impinging on the left L4 and left L5 nerve roots.     REVIEW OF SYSTEMS: Constitutional: No fevers, chills, sweats, or change in appetite Eyes: No visual changes, double vision, eye pain Ear, nose and throat: No hearing loss, ear pain, nasal congestion, sore throat Cardiovascular: No chest pain, palpitations Respiratory: No shortness of breath at rest or with exertion.   No wheezes GastrointestinaI: No nausea, vomiting, diarrhea, abdominal pain, fecal incontinence Genitourinary: No dysuria, urinary retention or frequency.  No nocturia. Musculoskeletal: No neck pain, back pain Integumentary: No rash, pruritus, skin lesions Neurological: as above Psychiatric: No depression at this time.  No anxiety Endocrine: No palpitations, diaphoresis, change in appetite, change in weigh or increased thirst Hematologic/Lymphatic: No anemia, purpura, petechiae. Allergic/Immunologic: No itchy/runny eyes, nasal  congestion, recent allergic reactions, rashes  ALLERGIES: Allergies  Allergen Reactions  . No Known Allergies     HOME MEDICATIONS:  Current Outpatient Medications:  .  acetaminophen (TYLENOL) 500 MG tablet, Take 1,000 mg by mouth every 6 (six) hours as needed for mild pain or moderate pain., Disp: , Rfl:  .  ALPRAZolam (XANAX) 0.5 MG tablet, Take 0.5 mg by mouth at  bedtime. *May take twice daily as needed, Disp: , Rfl:  .  atorvastatin (LIPITOR) 20 MG tablet, Take 20 mg by mouth at bedtime. , Disp: , Rfl:  .  escitalopram (LEXAPRO) 20 MG tablet, Take 20 mg by mouth daily. , Disp: , Rfl:  .  estradiol (ESTRACE) 0.5 MG tablet, Take 0.5 mg by mouth every other day., Disp: , Rfl:  .  pantoprazole (PROTONIX) 40 MG tablet, Take 40 mg by mouth daily., Disp: , Rfl:  .  traZODone (DESYREL) 100 MG tablet, Take 100 mg by mouth 3 (three) times daily. , Disp: , Rfl:  .  pregabalin (LYRICA) 75 MG capsule, One po qAM and two po qHS., Disp: 90 capsule, Rfl: 5  PAST MEDICAL HISTORY: Past Medical History:  Diagnosis Date  . Depression   . Dysphagia 06/16/2017  . GERD (gastroesophageal reflux disease)   . Headache   . Hypercholesteremia     PAST SURGICAL HISTORY: Past Surgical History:  Procedure Laterality Date  . ABDOMINAL HYSTERECTOMY    . ANTERIOR LAT LUMBAR FUSION Right 01/21/2017   Procedure: RIGHT LUMBAR THREE-FOUR LUMBAR FOUR - FIVE ANTEROLATERAL LUMBAR INTERBODY FUSION WITH PERCUTANEOUS PEDICLE SCREWS;  Surgeon: Maeola Harman, MD;  Location: [facility] OR;  Service: Neurosurgery;  Laterality: Right;  RIGHT L3-4 L4-5 ANTEROLATERAL LUMBAR INTERBODY FUSION WITH PERCUTANEOUS PEDICLE SCREWS  . BACK SURGERY    . BALLOON DILATION N/A 04/08/2014   Procedure: BALLOON DILATION;  Surgeon: Malissa Hippo, MD;  Location: [facility];  Service: Endoscopy;  Laterality: N/A;  . BLADDER SUSPENSION  2013   [facility]  . CHOLECYSTECTOMY    . ERCP N/A 04/08/2014   Procedure: ENDOSCOPIC RETROGRADE CHOLANGIOPANCREATOGRAPHY (ERCP);  Surgeon: Malissa Hippo, MD;  Location: [facility];  Service: Endoscopy;  Laterality: N/A;  . LUMBAR PERCUTANEOUS PEDICLE SCREW 2 LEVEL Right 01/21/2017   Procedure: LUMBAR PERCUTANEOUS PEDICLE SCREW 2 LEVEL;  Surgeon: Maeola Harman, MD;  Location: [facility] OR;  Service: Neurosurgery;  Laterality: Right;  . NASAL SEPTUM SURGERY  2013   [facility]  .  PANCREATIC STENT PLACEMENT N/A 04/08/2014   Procedure: PANCREATIC STENT PLACEMENT;  Surgeon: Malissa Hippo, MD;  Location: [facility];  Service: Endoscopy;  Laterality: N/A;  . SPHINCTEROTOMY N/A 04/08/2014   Procedure: BILIARY SPHINCTEROTOMY;  Surgeon: Malissa Hippo, MD;  Location: [facility];  Service: Endoscopy;  Laterality: N/A;    FAMILY HISTORY: History reviewed. No pertinent family history.  SOCIAL HISTORY:  Social History   Socioeconomic History  . Marital status: Married    Spouse name: Not on file  . Number of children: Not on file  . Years of education: Not on file  . Highest education level: Not on file  Occupational History  . Not on file  Tobacco Use  . Smoking status: Never Smoker  . Smokeless tobacco: Never Used  Substance and Sexual Activity  . Alcohol use: No  . Drug use: No  . Sexual activity: Not on file  Other Topics Concern  . Not on file  Social History Narrative   Lives with husband, Monque Haggar  Caffeine use: 1-2 cups coffee per day   Right handed    Social Determinants of Health   Financial Resource Strain:   . Difficulty of Paying Living Expenses:   Food Insecurity:   . Worried About Charity fundraiser in the Last Year:   . Arboriculturist in the Last Year:   Transportation Needs:   . Film/video editor (Medical):   Marland Kitchen Lack of Transportation (Non-Medical):   Physical Activity:   . Days of Exercise per Week:   . Minutes of Exercise per Session:   Stress:   . Feeling of Stress :   Social Connections:   . Frequency of Communication with Friends and Family:   . Frequency of Social Gatherings with Friends and Family:   . Attends Religious Services:   . Active Member of Clubs or Organizations:   . Attends Archivist Meetings:   Marland Kitchen Marital Status:   Intimate Partner Violence:   . Fear of Current or Ex-Partner:   . Emotionally Abused:   Marland Kitchen Physically Abused:   . Sexually Abused:      PHYSICAL EXAM  Vitals:   04/11/20 1418   BP: (!) 159/75  Pulse: (!) 57  Temp: 98.1 F (36.7 C)  Weight: 138 lb 8 oz (62.8 kg)  Height: 5\' 1"  (1.549 m)    Body mass index is 26.17 kg/m.   General: The patient is well-developed and well-nourished and in no acute distress  HEENT:  Head is [place]/AT.  Sclera are anicteric.  Funduscopic exam shows normal optic discs and retinal vessels.  Neck: No carotid bruits are noted.  The neck is nontender.  Cardiovascular: The heart has a regular rate and rhythm with a normal S1 and S2. There were no murmurs, gallops or rubs.    Skin: Extremities are without rash or  edema.  Musculoskeletal: She is very tender over the trochanteric bursa region on the right.  She has mild tenderness over the lower lumbar paraspinal muscles.  There is no piriformis muscle tenderness.  Neurologic Exam  Mental status: The patient is alert and oriented x 3 at the time of the examination. The patient has apparent normal recent and remote memory, with an apparently normal attention span and concentration ability.   Speech is normal.  Cranial nerves: Extraocular movements are full. .Facial strength is normal.  Trapezius and sternocleidomastoid strength is normal. No dysarthria is noted.  No obvious hearing deficits are noted.  Motor:  Muscle bulk is normal.   Tone is normal. Strength is  5 / 5 in all 4 extremities.   Sensory: Sensory testing is intact to pinprick, soft touch and vibration sensation in arms.   She has reduced sensation to temp and touch over the right anterolateral thigh.     Coordination: Cerebellar testing reveals good finger-nose-finger and heel-to-shin bilaterally.  Gait and station: Station is normal.   Gait has a limp favoring left leg but no clear foot drop.    Romberg is negative.   Reflexes: Deep tendon reflexes are symmetric and normal bilaterally.   Plantar responses are flexor.      ASSESSMENT AND PLAN  Right hip pain  History of lumbar fusion  Protrusion of lumbar  intervertebral disc  Numbness of right anterior thigh   In summary, Ms. Pacha is a 74 year old woman with pain predominantly in the right hip with some radiation down the posterior thigh but not into the foot.  On exam, she is very tender over  the greater trochanteric bursa region on the right and also has some tenderness in the lumbar paraspinal.  She has some altered sensation that could be coming from either the L3 dermatome or the lateral femoral cutaneous nerve in the right.  Her most recent lumbar spine MRI that I was able to review showed prior L3-L5 fusion with a small disc protrusion at L2-L3 toward the left.  These changes were not explain her pain.  Although her pain appears to be coming from the hip, she has been told by orthopedics that it is not coming from the hip.  She did have some altered sensation in the right anterolateral thigh so some superimposed neuropathic process cannot be ruled out.  Therefore, it is reasonable to have her try a centrally acting agent such as Lyrica.   However, if pain persists I would recommend that she be referred to a pain medicine clinic for further management.  She will return to see [place] in a couple months or sooner if she has new or worsening neurologic symptoms.    A. Epimenio Foot, MD, [facility] 04/11/2020, 9:16 PM Certified in Neurology, Clinical Neurophysiology, Sleep Medicine and Neuroimaging  [facility] [address], [address] [place], [place] [address] [phone]

## 2020-04-12 NOTE — Telephone Encounter (Signed)
Called, LVM letting pt know PA Lyrica denied. Per MD, she can use goodrx coupon to get medication fairly cheap. This way, she can still get prescription and try it. We will have to send to either [facility]. Price ranges from 16-18 dollars per 90 day supply. Asked her to call back to let me know what pharmacy she would like [place] to send it to and I will include goodrx info on prescription.

## 2020-04-12 NOTE — Telephone Encounter (Signed)
Submitted PA. Waiting on determination from [employer]. 

## 2020-04-17 ENCOUNTER — Telehealth: Payer: Self-pay | Admitting: Neurology

## 2020-04-17 MED ORDER — PREGABALIN 75 MG PO CAPS: ORAL_CAPSULE | ORAL | 1 refills | Status: DC

## 2020-04-17 NOTE — Telephone Encounter (Signed)
Pt called stating that her insurance does not cover the pregabalin (LYRICA) 75 MG capsule and she is wanting to know if something else can be called in for her. Please advise.

## 2020-04-17 NOTE — Telephone Encounter (Signed)
Called pt back. She is agreeable to have rx sent to [facility] in [place] and use goodrx coupon. Advised it will be about 38.46 for 90 days supply. She verbalized understanding.

## 2020-04-18 NOTE — Telephone Encounter (Signed)
Gave to MD for review.

## 2020-04-18 NOTE — Telephone Encounter (Signed)
R/c from [facility] imaging cd and report on [place] desk

## 2020-06-22 ENCOUNTER — Other Ambulatory Visit: Payer: Self-pay

## 2020-06-22 ENCOUNTER — Ambulatory Visit (INDEPENDENT_AMBULATORY_CARE_PROVIDER_SITE_OTHER): Payer: Medicare Other | Admitting: Family Medicine

## 2020-06-22 ENCOUNTER — Encounter: Payer: Self-pay | Admitting: Family Medicine

## 2020-06-22 VITALS — BP 107/67 | HR 70 | Ht 61.0 in | Wt 142.0 lb

## 2020-06-22 DIAGNOSIS — M25551 Pain in right hip: Secondary | ICD-10-CM | POA: Diagnosis not present

## 2020-06-22 DIAGNOSIS — M25552 Pain in left hip: Secondary | ICD-10-CM

## 2020-06-22 DIAGNOSIS — Z981 Arthrodesis status: Secondary | ICD-10-CM

## 2020-06-22 MED ORDER — PREGABALIN 75 MG PO CAPS: ORAL_CAPSULE | ORAL | 1 refills | Status: DC

## 2020-06-22 NOTE — Patient Instructions (Signed)
We will increase Lyrica to 75mg  in am, 75mg  at lunch and 150mg  at dinner. We may consider increasing dose further if you tolerate medication well but we have to be careful as it can cause you to feel dizzy or groggy.   Try to continue PT exercises.   Follow up with Dr 4-6 months    Low Back Sprain or Strain Rehab Ask your health care provider which exercises are safe for you. Do exercises exactly as told by your health care provider and adjust them as directed. It is normal to feel mild stretching, pulling, tightness, or discomfort as you do these exercises. Stop right away if you feel sudden pain or your pain gets worse. Do not begin these exercises until told by your health care provider. Stretching and range-of-motion exercises These exercises warm up your muscles and joints and improve the movement and flexibility of your back. These exercises also help to relieve pain, numbness, and tingling. Lumbar rotation  1. Lie on your back on a firm surface and bend your knees. 2. Straighten your arms out to your sides so each arm forms a 90-degree angle (right angle) with a side of your body. 3. Slowly move (rotate) both of your knees to one side of your body until you feel a stretch in your lower back (lumbar). Try not to let your shoulders lift off the floor. 4. Hold this position for __________ seconds. 5. Tense your abdominal muscles and slowly move your knees back to the starting position. 6. Repeat this exercise on the other side of your body. Repeat __________ times. Complete this exercise __________ times a day. Single knee to chest  1. Lie on your back on a firm surface with both legs straight. 2. Bend one of your knees. Use your hands to move your knee up toward your chest until you feel a gentle stretch in your lower back and buttock. ? Hold your leg in this position by holding on to the front of your knee. ? Keep your other leg as straight as possible. 3. Hold this position  for __________ seconds. 4. Slowly return to the starting position. 5. Repeat with your other leg. Repeat __________ times. Complete this exercise __________ times a day. Prone extension on elbows  1. Lie on your abdomen on a firm surface (prone position). 2. Prop yourself up on your elbows. 3. Use your arms to help lift your chest up until you feel a gentle stretch in your abdomen and your lower back. ? This will place some of your body weight on your elbows. If this is uncomfortable, try stacking pillows under your chest. ? Your hips should stay down, against the surface that you are lying on. Keep your hip and back muscles relaxed. 4. Hold this position for __________ seconds. 5. Slowly relax your upper body and return to the starting position. Repeat __________ times. Complete this exercise __________ times a day. Strengthening exercises These exercises build strength and endurance in your back. Endurance is the ability to use your muscles for a long time, even after they get tired. Pelvic tilt This exercise strengthens the muscles that lie deep in the abdomen. 1. Lie on your back on a firm surface. Bend your knees and keep your feet flat on the floor. 2. Tense your abdominal muscles. Tip your pelvis up toward the ceiling and flatten your lower back into the floor. ? To help with this exercise, you may place a small towel under your lower back and  try to push your back into the towel. 3. Hold this position for __________ seconds. 4. Let your muscles relax completely before you repeat this exercise. Repeat __________ times. Complete this exercise __________ times a day. Alternating arm and leg raises  1. Get on your hands and knees on a firm surface. If you are on a hard floor, you may want to use padding, such as an exercise mat, to cushion your knees. 2. Line up your arms and legs. Your hands should be directly below your shoulders, and your knees should be directly below your  hips. 3. Lift your left leg behind you. At the same time, raise your right arm and straighten it in front of you. ? Do not lift your leg higher than your hip. ? Do not lift your arm higher than your shoulder. ? Keep your abdominal and back muscles tight. ? Keep your hips facing the ground. ? Do not arch your back. ? Keep your balance carefully, and do not hold your breath. 4. Hold this position for __________ seconds. 5. Slowly return to the starting position. 6. Repeat with your right leg and your left arm. Repeat __________ times. Complete this exercise __________ times a day. Abdominal set with straight leg raise  1. Lie on your back on a firm surface. 2. Bend one of your knees and keep your other leg straight. 3. Tense your abdominal muscles and lift your straight leg up, 4-6 inches (10-15 cm) off the ground. 4. Keep your abdominal muscles tight and hold this position for __________ seconds. ? Do not hold your breath. ? Do not arch your back. Keep it flat against the ground. 5. Keep your abdominal muscles tense as you slowly lower your leg back to the starting position. 6. Repeat with your other leg. Repeat __________ times. Complete this exercise __________ times a day. Single leg lower with bent knees 1. Lie on your back on a firm surface. 2. Tense your abdominal muscles and lift your feet off the floor, one foot at a time, so your knees and hips are bent in 90-degree angles (right angles). ? Your knees should be over your hips and your lower legs should be parallel to the floor. 3. Keeping your abdominal muscles tense and your knee bent, slowly lower one of your legs so your toe touches the ground. 4. Lift your leg back up to return to the starting position. ? Do not hold your breath. ? Do not let your back arch. Keep your back flat against the ground. 5. Repeat with your other leg. Repeat __________ times. Complete this exercise __________ times a day. Posture and body  mechanics Good posture and healthy body mechanics can help to relieve stress in your body's tissues and joints. Body mechanics refers to the movements and positions of your body while you do your daily activities. Posture is part of body mechanics. Good posture means:  Your spine is in its natural S-curve position (neutral).  Your shoulders are pulled back slightly.  Your head is not tipped forward. Follow these guidelines to improve your posture and body mechanics in your everyday activities. Standing   When standing, keep your spine neutral and your feet about hip width apart. Keep a slight bend in your knees. Your ears, shoulders, and hips should line up.  When you do a task in which you stand in one place for a long time, place one foot up on a stable object that is 2-4 inches (5-10 cm) high, such  as a footstool. This helps keep your spine neutral. Sitting   When sitting, keep your spine neutral and keep your feet flat on the floor. Use a footrest, if necessary, and keep your thighs parallel to the floor. Avoid rounding your shoulders, and avoid tilting your head forward.  When working at a desk or a computer, keep your desk at a height where your hands are slightly lower than your elbows. Slide your chair under your desk so you are close enough to maintain good posture.  When working at a computer, place your monitor at a height where you are looking straight ahead and you do not have to tilt your head forward or downward to look at the screen. Resting  When lying down and resting, avoid positions that are most painful for you.  If you have pain with activities such as sitting, bending, stooping, or squatting, lie in a position in which your body does not bend very much. For example, avoid curling up on your side with your arms and knees near your chest (fetal position).  If you have pain with activities such as standing for a long time or reaching with your arms, lie with your  spine in a neutral position and bend your knees slightly. Try the following positions: ? Lying on your side with a pillow between your knees. ? Lying on your back with a pillow under your knees. Lifting   When lifting objects, keep your feet at least shoulder width apart and tighten your abdominal muscles.  Bend your knees and hips and keep your spine neutral. It is important to lift using the strength of your legs, not your back. Do not lock your knees straight out.  Always ask for help to lift heavy or awkward objects. This information is not intended to replace advice given to you by your health care provider. Make sure you discuss any questions you have with your health care provider. Document Revised: 04/09/2019 Document Reviewed: 01/07/2019 Elsevier Patient Education  [employer].   Radicular Pain Radicular pain is a type of pain that spreads from your back or neck along a spinal nerve. Spinal nerves are nerves that leave the spinal cord and go to the muscles. Radicular pain is sometimes called radiculopathy, radiculitis, or a pinched nerve. When you have this type of pain, you may also have weakness, numbness, or tingling in the area of your body that is supplied by the nerve. The pain may feel sharp and burning. Depending on which spinal nerve is affected, the pain may occur in the:  Neck area (cervical radicular pain). You may also feel pain, numbness, weakness, or tingling in the arms.  Mid-spine area (thoracic radicular pain). You would feel this pain in the back and chest. This type is rare.  Lower back area (lumbar radicular pain). You would feel this pain as low back pain. You may feel pain, numbness, weakness, or tingling in the buttocks or legs. Sciatica is a type of lumbar radicular pain that shoots down the back of the leg. Radicular pain occurs when one of the spinal nerves becomes irritated or squeezed (compressed). It is often caused by something pushing on a  spinal nerve, such as one of the bones of the spine (vertebrae) or one of the round cushions between vertebrae (intervertebral disks). This can result from:  An injury.  Wear and tear or aging of a disk.  The growth of a bone spur that pushes on the nerve. Radicular pain often  goes away when you follow instructions from your health care provider for relieving pain at home. Follow these instructions at home: Managing pain      If directed, put ice on the affected area: ? Put ice in a plastic bag. ? Place a towel between your skin and the bag. ? Leave the ice on for 20 minutes, 2-3 times a day.  If directed, apply heat to the affected area as often as told by your health care provider. Use the heat source that your health care provider recommends, such as a moist heat pack or a heating pad. ? Place a towel between your skin and the heat source. ? Leave the heat on for 20-30 minutes. ? Remove the heat if your skin turns bright red. This is especially important if you are unable to feel pain, heat, or cold. You may have a greater risk of getting burned. Activity   Do not sit or rest in bed for long periods of time.  Try to stay as active as possible. Ask your health care provider what type of exercise or activity is best for you.  Avoid activities that make your pain worse, such as bending and lifting.  Do not lift anything that is heavier than 10 lb (4.5 kg), or the limit that you are told, until your health care provider says that it is safe.  Practice using proper technique when lifting items. Proper lifting technique involves bending your knees and rising up.  Do strength and range-of-motion exercises only as told by your health care provider or physical therapist. General instructions  Take over-the-counter and prescription medicines only as told by your health care provider.  Pay attention to any changes in your symptoms.  Keep all follow-up visits as told by your health  care provider. This is important. ? Your health care provider may send you to a physical therapist to help with this pain. Contact a health care provider if:  Your pain and other symptoms get worse.  Your pain medicine is not helping.  Your pain has not improved after a few weeks of home care.  You have a fever. Get help right away if:  You have severe pain, weakness, or numbness.  You have difficulty with bladder or bowel control. Summary  Radicular pain is a type of pain that spreads from your back or neck along a spinal nerve.  When you have radicular pain, you may also have weakness, numbness, or tingling in the area of your body that is supplied by the nerve.  The pain may feel sharp or burning.  Radicular pain may be treated with ice, heat, medicines, or physical therapy. This information is not intended to replace advice given to you by your health care provider. Make sure you discuss any questions you have with your health care provider. Document Revised: 06/30/2018 Document Reviewed: 06/30/2018 Elsevier Patient Education  2020 [employer].

## 2020-06-22 NOTE — Progress Notes (Signed)
I have read the note, and I agree with the clinical assessment and plan.  Telford Archambeau A. Bradd Merlos, MD, PhD, FAAN Certified in Neurology, Clinical Neurophysiology, Sleep Medicine, Pain Medicine and Neuroimaging  [facility] [address], [address] [place], [place] [address] [phone]  

## 2020-06-22 NOTE — Progress Notes (Signed)
PATIENT: Julie [name] DOB: 1946-07-05  REASON FOR VISIT: follow up HISTORY FROM: patient  Chief Complaint  Patient presents with  . Follow-up    Pt here for a f/u on leg pain. Pt says the pain is in both legs now.     HISTORY OF PRESENT ILLNESS: Today 06/22/20 Julie [name] is a 74 y.o. female here today for follow up. She was started on Lyrica 75mg  in am and 150mg  at bedtime. She feels that it has helped some. She has tolerated it well with no obvious adverse effects. She reports maybe 30-40% reduction of pain. Pain seems to be worse in afternoon and evenings. She reports stinging pain in mid buttock to top of right thigh. Similar pain on left started about 3 weeks ago. She had gone on a vacation and ridden in a care for about 5 hours. She feels that she is putting more weight on her left leg as her right hurts so badly. She feels that her legs are weak but this is not new. No falls. No numbness.   HISTORY: (copied from Dr note on 04/11/2020)  I had the pleasure seeing your patient, Julie [name], at [facility] for neurologic consultation regarding her pain.  She is a 74 year old woman who had left leg pain leading to L3-L5 posterior lumbar interbody fusion in 2018 (Dr. 66).  Of note, she has transitional anatomy.  A few weeks after surgery she began to have right leg pain that radiates from her hip to her knee.   Pain is worse with walking or chores (sweep, vacuum) or bending over.   Pain has a stabbing quality.    She has had therapy and epidural steroid injections without benefit.   She was evaluated for a spinal cord stimulator but did not have benefit from the temporary device.      She had been on gabapentin and meloxicam in the past without benefit.   She has not been on Lyrica or other AEDs.     MRI of the lumbar spine 01/13/2019 showed the postoperative changes but there did not appear to be any nerve root compression or spinal stenosis that  could explain her symptoms.    She has seen Dr. Venetia Maxon who did a hip operation on the right (on bursa?).   She noted no improvement.    I personally reviewed MRI of the lumbar spine 01/13/2019 and x-ray of the lumbar spine 12/02/2018.  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  She has fusion at L3-L5 with posterior hardware and disc spacers in place.  There is disc protrusion at L2-L3 causing mild left lateral recess stenosis but no nerve root compression.  She did not appear to have epidural fibrosis.  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  At L3-L4 there is lateral recess stenosis impinging on the L4 nerve roots.  At L4-L5 there are degenerative changes impinging on the left L4 and left L5 nerve roots.    REVIEW OF SYSTEMS: Out of a complete 14 system review of symptoms, the patient complains only of the following symptoms, hip, back pain and all other reviewed systems are negative.  ALLERGIES: Allergies  Allergen Reactions  . No Known Allergies     HOME MEDICATIONS: Outpatient Medications Prior to Visit  Medication  Sig Dispense Refill  . acetaminophen (TYLENOL) 500 MG tablet Take 1,000 mg by mouth every 6 (six) hours as needed for mild pain or moderate pain.    Marland Kitchen ALPRAZolam (XANAX) 0.5 MG tablet Take 0.5 mg by mouth at bedtime. *May take twice daily as needed    . atorvastatin (LIPITOR) 20 MG tablet Take 20 mg by mouth at bedtime.     Marland Kitchen escitalopram (LEXAPRO) 20 MG tablet Take 20 mg by mouth daily.     Marland Kitchen estradiol (ESTRACE) 0.5 MG tablet Take 0.5 mg by mouth every other day.    . pantoprazole (PROTONIX) 40 MG tablet Take 40 mg by mouth daily.    . traZODone (DESYREL) 100 MG tablet Take 100 mg by mouth 3 (three) times daily.     . pregabalin (LYRICA) 75 MG capsule One po qAM and two po qHS. 270  capsule 1   No facility-administered medications prior to visit.    PAST MEDICAL HISTORY: Past Medical History:  Diagnosis Date  . Depression   . Dysphagia 06/16/2017  . GERD (gastroesophageal reflux disease)   . Headache   . Hypercholesteremia     PAST SURGICAL HISTORY: Past Surgical History:  Procedure Laterality Date  . ABDOMINAL HYSTERECTOMY    . ANTERIOR LAT LUMBAR FUSION Right 01/21/2017   Procedure: RIGHT LUMBAR THREE-FOUR LUMBAR FOUR - FIVE ANTEROLATERAL LUMBAR INTERBODY FUSION WITH PERCUTANEOUS PEDICLE SCREWS;  Surgeon: Julie Harman, MD;  Location: [facility] OR;  Service: Neurosurgery;  Laterality: Right;  RIGHT L3-4 L4-5 ANTEROLATERAL LUMBAR INTERBODY FUSION WITH PERCUTANEOUS PEDICLE SCREWS  . BACK SURGERY    . BALLOON DILATION N/A 04/08/2014   Procedure: BALLOON DILATION;  Surgeon: Malissa Hippo, MD;  Location: [facility] ORS;  Service: Endoscopy;  Laterality: N/A;  . BLADDER SUSPENSION  2013   [facility]  . CHOLECYSTECTOMY    . ERCP N/A 04/08/2014   Procedure: ENDOSCOPIC RETROGRADE CHOLANGIOPANCREATOGRAPHY (ERCP);  Surgeon: Malissa Hippo, MD;  Location: [facility] ORS;  Service: Endoscopy;  Laterality: N/A;  . LUMBAR PERCUTANEOUS PEDICLE SCREW 2 LEVEL Right 01/21/2017   Procedure: LUMBAR PERCUTANEOUS PEDICLE SCREW 2 LEVEL;  Surgeon: Julie Harman, MD;  Location[facility] OR;  Service: Neurosurgery;  Laterality: Right;  . NASAL SEPTUM SURGERY  2013   [facility]  . PANCREATIC STENT PLACEMENT N/A 04/08/2014   Procedure: PANCREATIC STENT PLACEMENT;  Surgeon: Malissa Hippo, MD;  Location: [facility]S;  Service: Endoscopy;  Laterality: N/A;  . SPHINCTEROTOMY N/A 04/08/2014   Procedure: BILIARY SPHINCTEROTOMY;  Surgeon: Malissa Hippo, MD;  Location: [facility];  Service: Endoscopy;  Laterality: N/A;    FAMILY HISTORY: No family history on file.  SOCIAL HISTORY: Social History   Socioeconomic History  . Marital status: Married    Spouse name: Not on file  . Number of children: Not on file   . Years of education: Not on file  . Highest education level: Not on file  Occupational History  . Not on file  Tobacco Use  . Smoking status: Never Smoker  . Smokeless tobacco: Never Used  Substance and Sexual Activity  . Alcohol use: No  . Drug use: No  . Sexual activity: Not on file  Other Topics Concern  . Not on file  Social History Narrative   Lives with husband, Joey Hudock   Caffeine use: 1-2 cups coffee per day   Right handed    Social Determinants of Health   Financial Resource Strain:   . Difficulty of Paying Living Expenses:  Food Insecurity:   . Worried About Programme researcher, broadcasting/film/video in the Last Year:   . Barista in the Last Year:   Transportation Needs:   . Freight forwarder (Medical):   Marland Kitchen Lack of Transportation (Non-Medical):   Physical Activity:   . Days of Exercise per Week:   . Minutes of Exercise per Session:   Stress:   . Feeling of Stress :   Social Connections:   . Frequency of Communication with Friends and Family:   . Frequency of Social Gatherings with Friends and Family:   . Attends Religious Services:   . Active Member of Clubs or Organizations:   . Attends Banker Meetings:   Marland Kitchen Marital Status:   Intimate Partner Violence:   . Fear of Current or Ex-Partner:   . Emotionally Abused:   Marland Kitchen Physically Abused:   . Sexually Abused:       PHYSICAL EXAM  Vitals:   06/22/20 1055  BP: 107/67  Pulse: 70  Weight: 142 lb (64.4 kg)  Height: 5\' 1"  (1.549 m)   Body mass index is 26.83 kg/m.  Generalized: Well developed, in no acute distress  Cardiology: normal rate and rhythm, no murmur noted Respiratory: clear to auscultation bilaterally  Neurological examination  Mentation: Alert oriented to time, place, history taking. Follows all commands speech and language fluent Cranial nerve II-XII: Pupils were equal round reactive to light. Extraocular movements were full, visual field were full on confrontational test. Facial  sensation and strength were normal. Uvula tongue midline. Head turning and shoulder shrug  were normal and symmetric. Motor: The motor testing reveals 5 over 5 strength of all 4 extremities. Good symmetric motor tone is noted throughout.  Sensory: Sensory testing is intact to soft touch on all 4 extremities. No evidence of extinction is noted.  Coordination: Cerebellar testing reveals good finger-nose-finger and heel-to-shin bilaterally.  Gait and station: right limp, no obvious foot drop, stable with no assistive device. Did not attempt tandem.    DIAGNOSTIC DATA (LABS, IMAGING, TESTING) - I reviewed patient records, labs, notes, testing and imaging myself where available.  No flowsheet data found.   Lab Results  Component Value Date   WBC 7.6 04/22/2018   HGB 12.7 04/22/2018   HCT 40.6 04/22/2018   MCV 98.5 04/22/2018   PLT 215 04/22/2018      Component Value Date/Time   NA 137 04/22/2018 1546   K 3.7 04/22/2018 1546   CL 104 04/22/2018 1546   CO2 24 04/22/2018 1546   GLUCOSE 110 (H) 04/22/2018 1546   BUN 15 04/22/2018 1546   CREATININE 0.67 04/22/2018 1546   CALCIUM 8.6 (L) 04/22/2018 1546   PROT 6.1 (L) 04/22/2018 1546   ALBUMIN 3.5 04/22/2018 1546   AST 19 04/22/2018 1546   ALT 23 04/22/2018 1546   ALKPHOS 89 04/22/2018 1546   BILITOT 0.4 04/22/2018 1546   GFRNONAA >60 04/22/2018 1546   GFRAA >60 04/22/2018 1546   No results found for: CHOL, HDL, LDLCALC, LDLDIRECT, TRIG, CHOLHDL No results found for: 04/24/2018 No results found for: VITAMINB12 No results found for: TSH     ASSESSMENT AND PLAN 74 y.o. year old female  has a past medical history of Depression, Dysphagia (06/16/2017), GERD (gastroesophageal reflux disease), Headache, and Hypercholesteremia. here with     ICD-10-CM   1. Right hip pain  M25.551   2. History of lumbar fusion  Z98.1   3. Left hip pain  M25.552  Breslyn continues to note pain of right hip and, unfortunately, has started having  similar pain on the left side. She did get some relief with Lyrica. She feels that it does not help as much in the afternoons and evenings. I will increase dose to 75mg  in am, 75mg  at lunch and 150mg  at dinner. We have discussed potential side effects in detail. She will monitor closely for any concerns of sedation. Safety precautions reviewed. She was educated on exercises to help stretch her hip and encouraged to stay active. She will follow up with [place] in 4-6 months, sooner if needed. She verbalizes understanding and agreement with this plan.    No orders of the defined types were placed in this encounter.    Meds ordered this encounter  Medications  . pregabalin (LYRICA) 75 MG capsule    Sig: One po qAM, one po at lunch and two po qHS.    Dispense:  360 capsule    Refill:  1    Pt will use goodrx coupon Member ID [id number] Group [id number] Kara Dies [id number] PCN [place]    Order Specific Question:   Supervising Provider    Answer:   Melvenia Beam [[id number]]      I spent 15 minutes with the patient. 50% of this time was spent counseling and educating patient on plan of care and medications.    Debbora Presto, FNP-C 06/22/2020, 12:19 PM [facility] [address] [place], [place] [address] [phone]

## 2020-10-26 ENCOUNTER — Encounter: Payer: Self-pay | Admitting: Neurology

## 2020-10-26 ENCOUNTER — Ambulatory Visit (INDEPENDENT_AMBULATORY_CARE_PROVIDER_SITE_OTHER): Payer: Medicare Other | Admitting: Neurology

## 2020-10-26 VITALS — BP 131/60 | HR 50 | Ht 61.0 in | Wt 146.0 lb

## 2020-10-26 DIAGNOSIS — M25551 Pain in right hip: Secondary | ICD-10-CM

## 2020-10-26 DIAGNOSIS — M5126 Other intervertebral disc displacement, lumbar region: Secondary | ICD-10-CM | POA: Diagnosis not present

## 2020-10-26 DIAGNOSIS — Z981 Arthrodesis status: Secondary | ICD-10-CM

## 2020-10-26 NOTE — Progress Notes (Signed)
[facility]  PATIENT: Julie [name] DOB: [birth year]  REFERRING DOCTOR OR PCP: Julie Rabon, MD SOURCE: Patient, notes from Dr. Arlyce Dice,  _________________________________   HISTORICAL  CHIEF COMPLAINT:  Chief Complaint  Patient presents with  . Follow-up    RM 16, alone. Last seen 06/22/2020. Left/right hip pain. Taking Lyrica 75mg  in the am and lunch, 150mg  at dinner. No new sx, Lyrica helping with pain. No falls per pt.     HISTORY OF PRESENT ILLNESS:  Julie [name] is a 74 y.o. [name] with right flank/hip pain  Update 10/26/2020: She feels her pain has done better    She still sometimes has right hip and flank pain but the intensity is mild and it is tolerable.   She is on Lyrica and pain quickly improved.     She is on 75 mg po qAM, 75 mg po qPM and 150 mg qHS.  .   She sleeps better at night on Lyrica as well. She sometimes takes a nap     Back pain, radiculopathy history: She is a 74 y.o. [name] who had left leg pain leading to L3-L5 posterior lumbar interbody fusion in 2018 (Dr. 66).  Of note, she has transitional anatomy.  A few weeks after surgery she began to have right leg pain that radiates from her hip to her knee.   Pain is worse with walking or chores (sweep, vacuum) or bending over.   Pain has a stabbing quality.    She has had therapy and epidural steroid injections without benefit.   She was evaluated for a spinal cord stimulator but did not have benefit from the temporary device.        Imaging: MRI of the lumbar spine 01/13/2019 showed the postoperative changes but there did not appear to be any nerve root compression or spinal stenosis that could explain her symptoms.    She has seen Dr. Venetia Maxon who did a hip operation on the right (on bursa?).   She noted no improvement.    MRI of the lumbar spine 01/13/2019 and x-ray of the lumbar spine 12/02/2018.  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  She has fusion at L3-L5 with posterior hardware and disc spacers in place.  There is disc protrusion at L2-L3 causing mild left lateral recess stenosis but no nerve root compression.  She did not appear to have epidural fibrosis.  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  At L3-L4 there is lateral recess stenosis impinging on the L4 nerve roots.  At L4-L5 there are degenerative changes impinging on the left L4 and left L5 nerve roots.     REVIEW OF SYSTEMS: Constitutional: No fevers, chills, sweats, or change in appetite Eyes: No visual changes, double vision, eye pain Ear, nose and throat: No hearing loss, ear pain, nasal congestion, sore throat Cardiovascular: No chest pain, palpitations Respiratory: No shortness of breath at rest or with exertion.   No wheezes GastrointestinaI: No nausea, vomiting, diarrhea, abdominal pain, fecal incontinence Genitourinary: No dysuria, urinary retention or frequency.  No nocturia. Musculoskeletal: No neck pain, back pain Integumentary: No rash, pruritus, skin lesions Neurological: as above Psychiatric: No depression at this time.  No anxiety Endocrine: No palpitations, diaphoresis, change in appetite, change in weigh or increased thirst Hematologic/Lymphatic: No anemia, purpura, petechiae. Allergic/Immunologic:  No itchy/runny eyes, nasal congestion, recent allergic reactions, rashes  ALLERGIES: Allergies  Allergen Reactions  . No Known Allergies     HOME MEDICATIONS:  Current Outpatient Medications:  .  acetaminophen (TYLENOL) 500 MG tablet, Take 1,000 mg by mouth every 6 (six) hours as needed for mild pain or moderate pain., Disp: , Rfl:  .  ALPRAZolam (XANAX) 0.5 MG tablet, Take 0.5 mg by mouth at bedtime. *May take twice daily as needed, Disp: , Rfl:  .  atorvastatin (LIPITOR) 20 MG tablet, Take 20 mg  by mouth at bedtime. , Disp: , Rfl:  .  escitalopram (LEXAPRO) 20 MG tablet, Take 20 mg by mouth daily. , Disp: , Rfl:  .  estradiol (ESTRACE) 0.5 MG tablet, Take 0.5 mg by mouth every other day., Disp: , Rfl:  .  pantoprazole (PROTONIX) 40 MG tablet, Take 40 mg by mouth daily., Disp: , Rfl:  .  pregabalin (LYRICA) 75 MG capsule, One po qAM, one po at lunch and two po qHS., Disp: 360 capsule, Rfl: 1 .  traZODone (DESYREL) 100 MG tablet, Take 100 mg by mouth 3 (three) times daily. , Disp: , Rfl:   PAST MEDICAL HISTORY: Past Medical History:  Diagnosis Date  . Depression   . Dysphagia 06/16/2017  . GERD (gastroesophageal reflux disease)   . Headache   . Hypercholesteremia     PAST SURGICAL HISTORY: Past Surgical History:  Procedure Laterality Date  . ABDOMINAL HYSTERECTOMY    . ANTERIOR LAT LUMBAR FUSION Right 01/21/2017   Procedure: RIGHT LUMBAR THREE-FOUR LUMBAR FOUR - FIVE ANTEROLATERAL LUMBAR INTERBODY FUSION WITH PERCUTANEOUS PEDICLE SCREWS;  Surgeon: Maeola Harman, MD;  Location: [facility] OR;  Service: Neurosurgery;  Laterality: Right;  RIGHT L3-4 L4-5 ANTEROLATERAL LUMBAR INTERBODY FUSION WITH PERCUTANEOUS PEDICLE SCREWS  . BACK SURGERY    . BALLOON DILATION N/A 04/08/2014   Procedure: BALLOON DILATION;  Surgeon: Malissa Hippo, MD;  Location: [facility] ORS;  Service: Endoscopy;  Laterality: N/A;  . BLADDER SUSPENSION  2013   [facility]  . CHOLECYSTECTOMY    . ERCP N/A 04/08/2014   Procedure: ENDOSCOPIC RETROGRADE CHOLANGIOPANCREATOGRAPHY (ERCP);  Surgeon: Malissa Hippo, MD;  Location: [facility];  Service: Endoscopy;  Laterality: N/A;  . LUMBAR PERCUTANEOUS PEDICLE SCREW 2 LEVEL Right 01/21/2017   Procedure: LUMBAR PERCUTANEOUS PEDICLE SCREW 2 LEVEL;  Surgeon: Maeola Harman, MD;  Location: [facility] OR;  Service: Neurosurgery;  Laterality: Right;  . NASAL SEPTUM SURGERY  2013   [facility]  . PANCREATIC STENT PLACEMENT N/A 04/08/2014   Procedure: PANCREATIC STENT PLACEMENT;  Surgeon: Malissa Hippo, MD;  Location: [facility];  Service: Endoscopy;  Laterality: N/A;  . SPHINCTEROTOMY N/A 04/08/2014   Procedure: BILIARY SPHINCTEROTOMY;  Surgeon: Malissa Hippo, MD;  Location: [facility]S;  Service: Endoscopy;  Laterality: N/A;    FAMILY HISTORY: No family history on file.  SOCIAL HISTORY:  Social History   Socioeconomic History  . Marital status: Married    Spouse name: Not on file  . Number of children: Not on file  . Years of education: Not on file  . Highest education level: Not on file  Occupational History  . Not on file  Tobacco Use  . Smoking status: Never Smoker  . Smokeless tobacco: Never Used  Substance and Sexual Activity  . Alcohol use: No  . Drug use: No  . Sexual activity: Not on file  Other Topics Concern  . Not on file  Social History Narrative  Lives with husband, Sanye Ledesma   Caffeine use: 1-2 cups coffee per day   Right handed    Social Determinants of Health   Financial Resource Strain:   . Difficulty of Paying Living Expenses: Not on file  Food Insecurity:   . Worried About Programme researcher, broadcasting/film/video in the Last Year: Not on file  . Ran Out of Food in the Last Year: Not on file  Transportation Needs:   . Lack of Transportation (Medical): Not on file  . Lack of Transportation (Non-Medical): Not on file  Physical Activity:   . Days of Exercise per Week: Not on file  . Minutes of Exercise per Session: Not on file  Stress:   . Feeling of Stress : Not on file  Social Connections:   . Frequency of Communication with Friends and Family: Not on file  . Frequency of Social Gatherings with Friends and Family: Not on file  . Attends Religious Services: Not on file  . Active Member of Clubs or Organizations: Not on file  . Attends Banker Meetings: Not on file  . Marital Status: Not on file  Intimate Partner Violence:   . Fear of Current or Ex-Partner: Not on file  . Emotionally Abused: Not on file  . Physically Abused: Not on file  .  Sexually Abused: Not on file     PHYSICAL EXAM  Vitals:   10/26/20 1508  BP: 131/60  Pulse: (!) 50  SpO2: 96%  Weight: 146 lb (66.2 kg)  Height: 5\' 1"  (1.549 m)    Body mass index is 27.59 kg/m.   General: The patient is well-developed and well-nourished and in no acute distress  HEENT:  Head is [place]/AT.    Musculoskeletal: Now no tenderness over the trochanteric bursa region on the right or lower lumbar paraspinal muscles.  There is no piriformis muscle tenderness.  Neurologic Exam  Mental status: The patient is alert and oriented x 3 at the time of the examination. The patient has apparent normal recent and remote memory, with an apparently normal attention span and concentration ability.   Speech is normal.  Cranial nerves: Extraocular movements are full. .Facial strength is normal.   Motor:  Muscle bulk is normal.   Tone is normal. Strength is  5 / 5 in all 4 extremities.   Sensory: Sensory testing is intact to pinprick, soft touch and vibration sensation in legs     Gait and station: Station is normal.   She walks with a slight limp on the left.  Tandem is poor.    Romberg is negative.   Reflexes: Deep tendon reflexes are symmetric and normal in legs     ASSESSMENT AND PLAN  Right hip pain  History of lumbar fusion  Protrusion of lumbar intervertebral disc   1.  Continue Lyrica 75-75-150 mg for pain.   Can try to reduce dose by one pill.    2.   RTC in 6 months as on controlled substance.    3.   She has bradycardia.   If her PCP will write the Lyrica we can just make the follow up as needed.     Jayln Branscom A. , MD, [facility] 10/26/2020, 3:52 PM Certified in Neurology, Clinical Neurophysiology, Sleep Medicine and Neuroimaging  [facility] [address], [address] [place], [place] [phone]

## 2021-01-18 ENCOUNTER — Other Ambulatory Visit: Payer: Self-pay | Admitting: Neurology

## 2021-01-18 MED ORDER — PREGABALIN 75 MG PO CAPS: ORAL_CAPSULE | ORAL | 1 refills | Status: DC

## 2021-01-18 NOTE — Addendum Note (Signed)
Addended byYetta Barre, Chayanne Speir L on: 01/18/2021 10:00 AM   Modules accepted: Orders

## 2021-01-18 NOTE — Telephone Encounter (Signed)
Pt. is requesting a refill for pregabalin (LYRICA) 75 MG capsule.  Pharmacy: [facility] # [phone]

## 2021-01-18 NOTE — Telephone Encounter (Signed)
Checked drug registry. She last refilled 10/04/20 #360. Last seen 10/26/20 and next f/u 04/26/21.

## 2021-04-26 ENCOUNTER — Ambulatory Visit: Payer: Medicare Other | Admitting: Family Medicine

## 2021-05-29 ENCOUNTER — Telehealth: Payer: Self-pay | Admitting: Neurology

## 2021-05-29 NOTE — Telephone Encounter (Signed)
Pt is requesting a refill for pregabalin (LYRICA) 75 MG capsule.  Pharmacy: [facility] # [phone]

## 2021-05-29 NOTE — Telephone Encounter (Signed)
Spoke to patient, she hadn't called pharmacy and just thought she was out she needed a new prescription.  I went back over when medication was prescribed, days in each refill and refills given.  Patient denied further questions, verbalized understanding and expressed appreciation for the phone call.

## 2021-05-29 NOTE — Telephone Encounter (Signed)
Pt has called Cicero Duck, RN back to inform she has been out of this medication for over a week.  Please call pt on her cell#

## 2021-05-29 NOTE — Telephone Encounter (Signed)
LVM for patient to return call.  Patient was given prescription for Lyrica on 01/18/21 for 90 days with 1 refill.  Patient should still have refill and/or medication available.Last refill should be 06/18/21.

## 2021-07-17 ENCOUNTER — Ambulatory Visit (INDEPENDENT_AMBULATORY_CARE_PROVIDER_SITE_OTHER): Payer: Medicare Other | Admitting: Family Medicine

## 2021-07-17 ENCOUNTER — Encounter: Payer: Self-pay | Admitting: Family Medicine

## 2021-07-17 VITALS — BP 169/82 | HR 73 | Ht 59.0 in | Wt 151.4 lb

## 2021-07-17 DIAGNOSIS — M25562 Pain in left knee: Secondary | ICD-10-CM

## 2021-07-17 DIAGNOSIS — M25552 Pain in left hip: Secondary | ICD-10-CM

## 2021-07-17 DIAGNOSIS — M25551 Pain in right hip: Secondary | ICD-10-CM | POA: Diagnosis not present

## 2021-07-17 DIAGNOSIS — Z981 Arthrodesis status: Secondary | ICD-10-CM

## 2021-07-17 NOTE — Patient Instructions (Addendum)
Below is our plan:  We will continue Lyrica 75mg  in the morning, evening and 150mg  at bedtime. We will continue to monitor pain. I feel like your left leg may be painful due to the knee pain. I am glad you have an appointment with Dr Thursday. Keep me posted! Monitor blood pressure. Call Dr Sibyl Parr if BP stays elevated.   Please make sure you are staying well hydrated. I recommend 50-60 ounces daily. Well balanced diet and regular exercise encouraged. Consistent sleep schedule with 6-8 hours recommended.   Please continue follow up with care team as directed.   Follow up with Dr Saturday in 6 months   You may receive a survey regarding today's visit. I encourage you to leave honest feed back as I do use this information to improve patient care. Thank you for seeing me today!

## 2021-07-17 NOTE — Progress Notes (Addendum)
Chief Complaint  Patient presents with   Follow-up    New rm, alone. Here for leg pn f/u, pt states right leg is doing well. Pn is now in left leg, onset 3 months ago. Pt had a fall about 6 weeks, fell on her knee. States it continuously hurts when she walks.       HISTORY OF PRESENT ILLNESS: 07/17/21 ALL:  Julie [name] is a 75 y.o. female here today for follow up for right hip/flank pain. She continues Lyrica 75mg  in am, pm and 150mg  QHS. She reports that pain was very well managed until about 6 weeks ago. She tripped over a frame in the floor and fell onto her left knee. Since, she has had significant left knee and left hip pain. It is worse with exercise and better with rest. She has not taken any other pain medicaitons for knee. She has an appt with Dr , ortho, in two days.   HISTORY (copied from Dr previous note)  Julie [name] is a 75 y.o. woman with right flank/hip pain   Update 10/26/2020: She feels her pain has done better    She still sometimes has right hip and flank pain but the intensity is mild and it is tolerable.   She is on Lyrica and pain quickly improved.     She is on 75 mg po qAM, 75 mg po qPM and 150 mg qHS.  .   She sleeps better at night on Lyrica as well. She sometimes takes a nap      Back pain, radiculopathy history: She is a 75 y.o. woman who had left leg pain leading to L3-L5 posterior lumbar interbody fusion in 2018 (Dr. 66).  Of note, she has transitional anatomy.  A few weeks after surgery she began to have right leg pain that radiates from her hip to her knee.   Pain is worse with walking or chores (sweep, vacuum) or bending over.   Pain has a stabbing quality.    She has had therapy and epidural steroid injections without benefit.   She was evaluated for a spinal cord stimulator but did not have benefit from the temporary device.       Imaging: MRI of the lumbar spine 01/13/2019 showed the postoperative changes but there did not appear  to be any nerve root compression or spinal stenosis that could explain her symptoms.    She has seen Dr. Venetia Maxon who did a hip operation on the right (on bursa?).   She noted no improvement.     MRI of the lumbar spine 01/13/2019 and x-ray of the lumbar spine 12/02/2018.  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  She has fusion at L3-L5 with posterior hardware and disc spacers in place.  There is disc protrusion at L2-L3 causing mild left lateral recess stenosis but no nerve root compression.  She did not appear to have epidural fibrosis.  There did not appear to be any lower lumbar nerve root compression   Presurgical MRI from 07/15/2016 shows degenerative changes mostly at L3-L4 and L4-L5.  At L3-L4 there is lateral recess stenosis impinging on the L4 nerve roots.  At L4-L5 there are degenerative changes impinging on the left L4 and left L5 nerve roots.   REVIEW OF SYSTEMS: Out of a complete 14 system review of symptoms, the patient complains only of the following symptoms, left knee and hip  pain and all other reviewed systems are negative.   ALLERGIES: Allergies  Allergen Reactions   No Known Allergies      HOME MEDICATIONS: Outpatient Medications Prior to Visit  Medication Sig Dispense Refill   acetaminophen (TYLENOL) 500 MG tablet Take 1,000 mg by mouth every 6 (six) hours as needed for mild pain or moderate pain.     ALPRAZolam (XANAX) 0.5 MG tablet Take 0.5 mg by mouth at bedtime. *May take twice daily as needed     atorvastatin (LIPITOR) 20 MG tablet Take 20 mg by mouth at bedtime.      escitalopram (LEXAPRO) 20 MG tablet Take 20 mg by mouth daily.      estradiol (ESTRACE) 0.5 MG tablet Take 0.5 mg by mouth every other day.     pantoprazole (PROTONIX) 40 MG tablet Take 40 mg by mouth daily.     pregabalin (LYRICA) 75 MG capsule One po qAM, one po at lunch and two po qHS. 360 capsule 1   traZODone (DESYREL) 100  MG tablet Take 100 mg by mouth 3 (three) times daily.      No facility-administered medications prior to visit.     PAST MEDICAL HISTORY: Past Medical History:  Diagnosis Date   Depression    Dysphagia 06/16/2017   GERD (gastroesophageal reflux disease)    Headache    Hypercholesteremia      PAST SURGICAL HISTORY: Past Surgical History:  Procedure Laterality Date   ABDOMINAL HYSTERECTOMY     ANTERIOR LAT LUMBAR FUSION Right 01/21/2017   Procedure: RIGHT LUMBAR THREE-FOUR LUMBAR FOUR - FIVE ANTEROLATERAL LUMBAR INTERBODY FUSION WITH PERCUTANEOUS PEDICLE SCREWS;  Surgeon: Maeola Harman, MD;  Location: [facility] OR;  Service: Neurosurgery;  Laterality: Right;  RIGHT L3-4 L4-5 ANTEROLATERAL LUMBAR INTERBODY FUSION WITH PERCUTANEOUS PEDICLE SCREWS   BACK SURGERY     BALLOON DILATION N/A 04/08/2014   Procedure: BALLOON DILATION;  Surgeon: Malissa Hippo, MD;  Location: [facility];  Service: Endoscopy;  Laterality: N/A;   BLADDER SUSPENSION  2013   [facility]   CHOLECYSTECTOMY     ERCP N/A 04/08/2014   Procedure: ENDOSCOPIC RETROGRADE CHOLANGIOPANCREATOGRAPHY (ERCP);  Surgeon: Malissa Hippo, MD;  Location: [facility];  Service: Endoscopy;  Laterality: N/A;   LUMBAR PERCUTANEOUS PEDICLE SCREW 2 LEVEL Right 01/21/2017   Procedure: LUMBAR PERCUTANEOUS PEDICLE SCREW 2 LEVEL;  Surgeon: Maeola Harman, MD;  Location: [facility] OR;  Service: Neurosurgery;  Laterality: Right;   NASAL SEPTUM SURGERY  2013   [facility]   PANCREATIC STENT PLACEMENT N/A 04/08/2014   Procedure: PANCREATIC STENT PLACEMENT;  Surgeon: Malissa Hippo, MD;  Location: [facility];  Service: Endoscopy;  Laterality: N/A;   SPHINCTEROTOMY N/A 04/08/2014   Procedure: BILIARY SPHINCTEROTOMY;  Surgeon: Malissa Hippo, MD;  Location: [facility];  Service: Endoscopy;  Laterality: N/A;     FAMILY HISTORY: History reviewed. No pertinent family history.   SOCIAL HISTORY: Social History   Socioeconomic History   Marital status: Married     Spouse name: Not on file   Number of children: Not on file   Years of education: Not on file   Highest education level: Not on file  Occupational History   Not on file  Tobacco Use   Smoking status: Never   Smokeless tobacco: Never  Substance and Sexual Activity   Alcohol use: No   Drug use: No   Sexual activity: Not on file  Other Topics Concern   Not on file  Social History Narrative  Lives with husband, Jaxon Mynhier   Caffeine use: 1-2 cups coffee per day   Right handed    Social Determinants of Health   Financial Resource Strain: Not on file  Food Insecurity: Not on file  Transportation Needs: Not on file  Physical Activity: Not on file  Stress: Not on file  Social Connections: Not on file  Intimate Partner Violence: Not on file     PHYSICAL EXAM  Vitals:   07/17/21 1256  BP: (!) 169/82  Pulse: 73  Weight: 151 lb 6.4 oz (68.7 kg)  Height: 4\' 11"  (1.499 m)   Body mass index is 30.58 kg/m.   Generalized: Well developed, in no acute distress  Cardiology: normal rate and rhythm, no murmur auscultated  Respiratory: clear to auscultation bilaterally    Neurological examination  Mentation: Alert oriented to time, place, history taking. Follows all commands speech and language fluent Cranial nerve II-XII: Pupils were equal round reactive to light. Extraocular movements were full, visual field were full on confrontational test. Facial sensation and strength were normal.  Head turning and shoulder shrug  were normal and symmetric. Motor: The motor testing reveals 5 over 5 strength of all 4 extremities. Good symmetric motor tone is noted throughout.  Sensory: Sensory testing is intact to soft touch on all 4 extremities. No evidence of extinction is noted.  Coordination: Cerebellar testing reveals good finger-nose-finger and heel-to-shin bilaterally.  Gait and station: Gait is stable, mild left limp Reflexes: Deep tendon reflexes are symmetric and normal bilaterally.     DIAGNOSTIC DATA (LABS, IMAGING, TESTING) - I reviewed patient records, labs, notes, testing and imaging myself where available.  Lab Results  Component Value Date   WBC 7.6 04/22/2018   HGB 12.7 04/22/2018   HCT 40.6 04/22/2018   MCV 98.5 04/22/2018   PLT 215 04/22/2018      Component Value Date/Time   NA 137 04/22/2018 1546   K 3.7 04/22/2018 1546   CL 104 04/22/2018 1546   CO2 24 04/22/2018 1546   GLUCOSE 110 (H) 04/22/2018 1546   BUN 15 04/22/2018 1546   CREATININE 0.67 04/22/2018 1546   CALCIUM 8.6 (L) 04/22/2018 1546   PROT 6.1 (L) 04/22/2018 1546   ALBUMIN 3.5 04/22/2018 1546   AST 19 04/22/2018 1546   ALT 23 04/22/2018 1546   ALKPHOS 89 04/22/2018 1546   BILITOT 0.4 04/22/2018 1546   GFRNONAA >60 04/22/2018 1546   GFRAA >60 04/22/2018 1546   No results found for: CHOL, HDL, LDLCALC, LDLDIRECT, TRIG, CHOLHDL No results found for: 04/24/2018 No results found for: VITAMINB12 No results found for: TSH  No flowsheet data found.   No flowsheet data found.   ASSESSMENT AND PLAN  75 y.o. year old female  has a past medical history of Depression, Dysphagia (06/16/2017), GERD (gastroesophageal reflux disease), Headache, and Hypercholesteremia. here with    Right hip pain  History of lumbar fusion  Left hip pain  Acute pain of left knee  Julie [name] was doing very well until a recent fall. She is now having left knee pain that is most likely contributing to left hip pain. Exam is unremarkable. She is scheduled with orthopedics in 2 days. I have encouraged her to follow up pending this appointment and let me know what they say about her knee. We may consider additional workup for left hip pain if no etiology found with knee. She will continue Lyrica 75mg  twice daily and 300mg  at bedtime. PDMP reveals appropriate refills. 90 day  supply filled 05/29/21. She will monitor her BP at home. She is asymptomatic and reports normal readings are 120-130/80's. She is in pain, today.  She will call PCP if BP remains elevated once she returns home. She will continue healthy lifestyle habits and follow up with us in 4-6 months. She verbalizes understanding and agreement with this plan.    No orders of the defined types were placed in this encounter.    No orders of the defined types were placed in this encounter.     Shawnie DapperAmy Pavel Gadd, MSN, FNP-C 07/17/2021, 4:44 PM  [facility] [address], [address] [place], [place] [address] [phone]) [phone]    I have read the note, and I agree with the clinical assessment and plan.  Richard A. Epimenio FootSater, MD, PhD, [facility] Certified in Neurology, Clinical Neurophysiology, Sleep Medicine, Pain Medicine and Neuroimaging  [facility] [address], [address] [place], [place] [address] [phone]

## 2021-09-04 ENCOUNTER — Other Ambulatory Visit: Payer: Self-pay | Admitting: Neurology

## 2022-01-22 ENCOUNTER — Encounter: Payer: Self-pay | Admitting: Neurology

## 2022-01-22 ENCOUNTER — Ambulatory Visit (INDEPENDENT_AMBULATORY_CARE_PROVIDER_SITE_OTHER): Payer: Medicare Other | Admitting: Neurology

## 2022-01-22 ENCOUNTER — Telehealth: Payer: Self-pay | Admitting: Neurology

## 2022-01-22 VITALS — BP 125/66 | HR 56 | Ht 59.0 in | Wt 141.8 lb

## 2022-01-22 DIAGNOSIS — Z981 Arthrodesis status: Secondary | ICD-10-CM

## 2022-01-22 DIAGNOSIS — M25552 Pain in left hip: Secondary | ICD-10-CM | POA: Insufficient documentation

## 2022-01-22 DIAGNOSIS — M79605 Pain in left leg: Secondary | ICD-10-CM | POA: Insufficient documentation

## 2022-01-22 DIAGNOSIS — M5126 Other intervertebral disc displacement, lumbar region: Secondary | ICD-10-CM

## 2022-01-22 MED ORDER — MELOXICAM 7.5 MG PO TABS: ORAL_TABLET | ORAL | 11 refills | Status: DC

## 2022-01-22 NOTE — Telephone Encounter (Signed)
Medicare/aetna supp no auth req order sent to [facility] to be schedule at [facility], they will reach out to the patient to schedule.

## 2022-01-22 NOTE — Progress Notes (Signed)
[facility]  PATIENT: Julie [name] DOB: [birth year]  REFERRING DOCTOR OR PCP: Romeo RabonMichael Caplan, MD SOURCE: Patient, notes from Dr. Arlyce DiceKaplan,  _________________________________   HISTORICAL  CHIEF COMPLAINT:  Chief Complaint  Patient presents with   Follow-up    RM 2. Last seen 07/17/21. Pt reports both legs are giving her trouble. Has had no falls. Hurts when walking long distance.     HISTORY OF PRESENT ILLNESS:  Julie [name] is a 76 y.o. woman with right flank/hip pain  Update  01/22/2022: She is having more trouble walking up steps adn she feels her legs give out when she walks.   She also feels her arms are weaker    She feels these symptoms have slowly worsened over the last year.    She has right > left leg pai    Lyrica was helping her more at first but not much now.   She is on 75 mg po qAM, 75 mg po qPM and 150 mg qHS.  .   She sleeps better at night on Lyrica as well. She sometimes takes a nap     She denies neck pain.   No bladder changes.     Back pain, radiculopathy history: She had left leg pain leading to L3-L5 posterior lumbar interbody fusion in 2018 (Dr. Venetia MaxonStern).  Of note, she has transitional anatomy.  A few weeks after surgery she began to have right leg pain that radiates from her hip to her knee.   Pain is worse with walking or chores (sweep, vacuum) or bending over.   Pain has a stabbing quality.    She has had therapy and epidural steroid injections without benefit.   She was evaluated for a spinal cord stimulator but did not have benefit from the temporary device.        Imaging: MRI of the lumbar spine 01/13/2019 showed the postoperative changes but there did not appear to be any nerve root compression or spinal stenosis that could explain her symptoms.    She has seen Dr. Jerl Santosalldorf who did a hip operation on the right (on bursa?).   She noted no improvement.    MRI of the lumbar spine 01/13/2019 and x-ray of the lumbar spine 12/02/2018.  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  She has fusion at L3-L5 with posterior hardware and disc spacers in place.  There is disc protrusion at L2-L3 causing mild left lateral recess stenosis but no nerve root compression.  She did not appear to have epidural fibrosis.  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  At L3-L4 there is lateral recess stenosis impinging on the L4 nerve roots.  At L4-L5 there are degenerative changes impinging on the left L4 and left L5 nerve roots.     REVIEW OF SYSTEMS: Constitutional: No fevers, chills, sweats, or change in appetite Eyes: No visual changes, double vision, eye pain Ear, nose and throat: No hearing loss, ear pain, nasal congestion, sore throat Cardiovascular: No chest pain, palpitations Respiratory:  No shortness of breath at rest or with exertion.   No wheezes GastrointestinaI: No nausea, vomiting, diarrhea, abdominal pain, fecal incontinence Genitourinary:  No dysuria, urinary retention or frequency.  No nocturia. Musculoskeletal:  No neck pain, back pain Integumentary: No rash, pruritus, skin lesions Neurological: as above Psychiatric:  No depression at this time.  No anxiety Endocrine: No palpitations, diaphoresis, change in appetite, change in weigh or increased thirst Hematologic/Lymphatic:  No anemia, purpura, petechiae. Allergic/Immunologic: No itchy/runny eyes, nasal congestion, recent allergic reactions, rashes  ALLERGIES: Allergies  Allergen Reactions   No Known Allergies     HOME MEDICATIONS:  Current Outpatient Medications:    acetaminophen (TYLENOL) 500 MG tablet, Take 1,000 mg by mouth every 6 (six) hours as needed for mild pain or moderate pain., Disp: , Rfl:    ALPRAZolam (XANAX) 0.5 MG tablet, Take 0.5 mg by mouth at bedtime. *May take twice  daily as needed, Disp: , Rfl:    atorvastatin (LIPITOR) 20 MG tablet, Take 20 mg by mouth at bedtime. , Disp: , Rfl:    escitalopram (LEXAPRO) 20 MG tablet, Take 20 mg by mouth daily. , Disp: , Rfl:    estradiol (ESTRACE) 0.5 MG tablet, Take 0.5 mg by mouth every other day., Disp: , Rfl:    losartan (COZAAR) 50 MG tablet, Take 50 mg by mouth daily., Disp: , Rfl:    meloxicam (MOBIC) 7.5 MG tablet, One po qd, Disp: 30 tablet, Rfl: 11   pantoprazole (PROTONIX) 40 MG tablet, Take 40 mg by mouth daily., Disp: , Rfl:    pregabalin (LYRICA) 75 MG capsule, take one capsule by mouth in the morning, one capsule at lunch, and two capsules at bedtime, Disp: 360 capsule, Rfl: 1   traZODone (DESYREL) 100 MG tablet, Take 100 mg by mouth 3 (three) times daily. , Disp: , Rfl:   PAST MEDICAL HISTORY: Past Medical History:  Diagnosis Date   Depression    Dysphagia 06/16/2017   GERD (gastroesophageal reflux disease)    Headache    Hypercholesteremia     PAST SURGICAL HISTORY: Past Surgical History:  Procedure Laterality Date   ABDOMINAL HYSTERECTOMY     ANTERIOR LAT LUMBAR FUSION Right 01/21/2017   Procedure: RIGHT LUMBAR THREE-FOUR LUMBAR FOUR - FIVE ANTEROLATERAL LUMBAR INTERBODY FUSION WITH PERCUTANEOUS PEDICLE SCREWS;  Surgeon: Maeola Harman, MD;  Location: [facility] OR;  Service: Neurosurgery;  Laterality: Right;  RIGHT L3-4 L4-5 ANTEROLATERAL LUMBAR INTERBODY FUSION WITH PERCUTANEOUS PEDICLE SCREWS   BACK SURGERY     BALLOON DILATION N/A 04/08/2014   Procedure: BALLOON DILATION;  Surgeon: Malissa Hippo, MD;  Location: [facility];  Service: Endoscopy;  Laterality: N/A;   BLADDER SUSPENSION  2013   [facility]   CHOLECYSTECTOMY     ERCP N/A 04/08/2014   Procedure: ENDOSCOPIC RETROGRADE CHOLANGIOPANCREATOGRAPHY (ERCP);  Surgeon: Malissa Hippo, MD;  Location: [facility];  Service: Endoscopy;  Laterality: N/A;   LUMBAR PERCUTANEOUS PEDICLE SCREW 2 LEVEL Right 01/21/2017   Procedure: LUMBAR PERCUTANEOUS PEDICLE  SCREW 2 LEVEL;  Surgeon: Maeola Harman, MD;  Location: [facility];  Service: Neurosurgery;  Laterality: Right;   NASAL SEPTUM SURGERY  2013   [facility]   PANCREATIC STENT PLACEMENT N/A 04/08/2014   Procedure: PANCREATIC STENT PLACEMENT;  Surgeon: Malissa Hippo, MD;  Location: [facility];  Service: Endoscopy;  Laterality: N/A;   SPHINCTEROTOMY N/A 04/08/2014   Procedure: BILIARY SPHINCTEROTOMY;  Surgeon: Malissa Hippo, MD;  Location: [facility];  Service: Endoscopy;  Laterality: N/A;    FAMILY HISTORY: History reviewed. No pertinent family history.  SOCIAL HISTORY:  Social History   Socioeconomic History   Marital status: Married    Spouse name: Not on file   Number of children: Not on file   Years of education: Not on file  Highest education level: Not on file  Occupational History   Not on file  Tobacco Use   Smoking status: Never   Smokeless tobacco: Never  Substance and Sexual Activity   Alcohol use: No   Drug use: No   Sexual activity: Not on file  Other Topics Concern   Not on file  Social History Narrative   Lives with husband, Hani Patnode   Caffeine use: 1-2 cups coffee per day   Right handed    Social Determinants of Health   Financial Resource Strain: Not on file  Food Insecurity: Not on file  Transportation Needs: Not on file  Physical Activity: Not on file  Stress: Not on file  Social Connections: Not on file  Intimate Partner Violence: Not on file     PHYSICAL EXAM  Vitals:   01/22/22 1319  BP: 125/66  Pulse: (!) 56  Weight: 141 lb 12.8 oz (64.3 kg)  Height: 4\' 11"  (1.499 m)    Body mass index is 28.64 kg/m.   General: The patient is well-developed and well-nourished and in no acute distress  HEENT:  Head is [place]/AT.    Musculoskeletal: Now no tenderness over the trochanteric bursa region on the right or lower lumbar paraspinal muscles.  There is no piriformis muscle tenderness.  Neurologic Exam  Mental status: The patient is alert and  oriented x 3 at the time of the examination. The patient has apparent normal recent and remote memory, with an apparently normal attention span and concentration ability.   Speech is normal.  Cranial nerves: Extraocular movements are full. .Facial strength is normal.   Motor:  Muscle bulk is normal.   Tone is normal. Strength is  5 / 5 in all 4 extremities.   Sensory: Sensory testing is intact to pinprick, soft touch and vibration sensation in legs     Gait and station: Station is normal.   Her gait is arthritic and she has a mild limp.  She cannot tandem walk.  .    Romberg is negative.   Reflexes: Deep tendon reflexes are symmetric and normal in legs     ASSESSMENT AND PLAN  History of lumbar fusion - Plan: MR Lumbar Spine W Wo Contrast  Left leg pain - Plan: MR Lumbar Spine W Wo Contrast  Left hip pain  Protrusion of lumbar intervertebral disc   1.  Continue Lyrica 75-75-150 mg for pain.    Add meloxicam due to increased pain. 2.   Due to change in quality and location of pain and intensity, we will check MRI of the lumbar spine to determine if there has been progression of the degenerative changes requiring other treatments 3  RTC in 6 months as on controlled substance.    \  Ambreen Tufte A. , MD, [facility] 01/22/2022, 5:02 PM Certified in Neurology, Clinical Neurophysiology, Sleep Medicine and Neuroimaging  [facility] [address], [address] [place], [place] [id number]

## 2022-02-07 ENCOUNTER — Other Ambulatory Visit: Payer: Self-pay

## 2022-02-07 ENCOUNTER — Ambulatory Visit (HOSPITAL_COMMUNITY)
Admission: RE | Admit: 2022-02-07 | Discharge: 2022-02-07 | Disposition: A | Discharge status: Home / Self Care | Payer: Medicare Other | Source: Ambulatory Visit | Attending: Neurology | Admitting: Neurology

## 2022-02-07 DIAGNOSIS — Z981 Arthrodesis status: Secondary | ICD-10-CM | POA: Diagnosis not present

## 2022-02-07 DIAGNOSIS — M79605 Pain in left leg: Secondary | ICD-10-CM | POA: Diagnosis present

## 2022-02-07 MED ORDER — GADOBUTROL 1 MMOL/ML IV SOLN
6.0000 mL | Freq: Once | INTRAVENOUS | Status: AC | PRN
  Administered 2022-02-07: 6 mL via INTRAVENOUS

## 2022-02-18 ENCOUNTER — Telehealth: Payer: Self-pay

## 2022-02-18 NOTE — Progress Notes (Signed)
See telephone note from 02/18/22.

## 2022-02-18 NOTE — Telephone Encounter (Signed)
-----   Message from Asa Lente, MD sent at 02/18/2022  1:08 PM EST ----- Please let her know that the MRI of the lumbar spine did show some disc degenerative changes and misalignment of the vertebral body at L2-L3 above the levels of the fusion.  These degenerative changes have worsened compared to the 2020 MRI and could be playing some role in her symptoms.    However, there is no nerve root compression.  If pain is getting worse we could send her back to neurosurgery or spine surgery.    (If we do refer "76 year old woman with history of L3-L5 fusion now with pain and progressive degenerative changes at L2-L3 including retrolisthesis")

## 2022-02-18 NOTE — Telephone Encounter (Signed)
I called patient. I discussed her MRI lumbar results. Patient reports that her symptoms are stable. She will let us know if they worsen and we can reconsider the [facility] referral. Patient will keep her follow up as scheduled for July. Pt verbalized understanding of results. Pt had no questions at this time but was encouraged to call back if questions arise.

## 2022-07-22 ENCOUNTER — Ambulatory Visit: Payer: Medicare Other | Admitting: Family Medicine

## 2022-07-25 ENCOUNTER — Ambulatory Visit: Payer: Medicare Other | Admitting: Neurology

## 2022-09-17 ENCOUNTER — Encounter (INDEPENDENT_AMBULATORY_CARE_PROVIDER_SITE_OTHER): Payer: Self-pay | Admitting: *Deleted

## 2022-11-07 ENCOUNTER — Encounter (INDEPENDENT_AMBULATORY_CARE_PROVIDER_SITE_OTHER): Payer: Self-pay | Admitting: Gastroenterology

## 2022-11-28 ENCOUNTER — Ambulatory Visit (INDEPENDENT_AMBULATORY_CARE_PROVIDER_SITE_OTHER): Payer: Medicare Other | Admitting: Gastroenterology

## 2022-12-05 ENCOUNTER — Encounter: Payer: Self-pay | Admitting: Gastroenterology

## 2022-12-05 ENCOUNTER — Encounter: Payer: Self-pay | Admitting: *Deleted

## 2022-12-05 ENCOUNTER — Ambulatory Visit (INDEPENDENT_AMBULATORY_CARE_PROVIDER_SITE_OTHER): Payer: Medicare Other | Admitting: Gastroenterology

## 2022-12-05 ENCOUNTER — Other Ambulatory Visit: Payer: Self-pay | Admitting: Gastroenterology

## 2022-12-05 VITALS — BP 157/78 | HR 57 | Temp 97.2°F | Ht 59.0 in | Wt 134.0 lb

## 2022-12-05 DIAGNOSIS — L299 Pruritus, unspecified: Secondary | ICD-10-CM | POA: Diagnosis not present

## 2022-12-05 DIAGNOSIS — K529 Noninfective gastroenteritis and colitis, unspecified: Secondary | ICD-10-CM | POA: Diagnosis not present

## 2022-12-05 DIAGNOSIS — R933 Abnormal findings on diagnostic imaging of other parts of digestive tract: Secondary | ICD-10-CM | POA: Diagnosis not present

## 2022-12-05 NOTE — Progress Notes (Signed)
Gastroenterology Office Note    Referring Provider: Romeo Rabon, MD Primary Care Physician:  Romeo Rabon, MD     Chief Complaint   Chief Complaint  Patient presents with   decrease in appetite    Pt has a copy of pelvic scan that was done in [place].     History of Present Illness   Julie WILE is a 76 y.o. female presenting today at the request of Romeo Rabon, MD due to decrease in appetite and abnormal CT abd/pelvis with contrast done 09/10/2022 showing wall thickening around the GEJ, differentials including reflux vs mass.   She has had multiple EGDs/dilations in the past by Dr. Samuella Cota. Last in 2017. BPE in 2018 with small sliding hiatal hernia with partial Schatzki's ring but no obstructing of tablet. 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   Since August, has noted early satiety. Was placed on mirtazapine per PCP. Her intake is better now than previously but still not large portions. Chronic dysphagia. Chews really well. Couldn't eat shredded wheat. Bread is difficult. Some burning in epigastric area at times.   History of diarrhea for the past 6 months. NO abx in past 6 months. Normal BMs prior to this. No rectal bleeding. Has had as many as 5 loose stools per day. No new medications. Sometimes has to go at night in middle of night.  Lost 20 lbs since August. Usually weighs around 147. Today 134. Last colonoscopy about 10 years ago by Dr. Aleene Davidson. No polyps. History of multiple dilations in the past. A few Motrin daily due to back pain.   Notes diffuse pruritus since August. Worsened with wearing clothes. Throughout the day, she doesn't notice as much; however, at night it is worse. No jaundice.   Past Medical History:  Diagnosis Date   Depression    Dysphagia 06/16/2017   GERD (gastroesophageal reflux disease)    Headache    HTN (hypertension)    Hypercholesteremia     Past Surgical History:  Procedure Laterality Date   ABDOMINAL  HYSTERECTOMY     ANTERIOR LAT LUMBAR FUSION Right 01/21/2017   Procedure: RIGHT LUMBAR THREE-FOUR LUMBAR FOUR - FIVE ANTEROLATERAL LUMBAR INTERBODY FUSION WITH PERCUTANEOUS PEDICLE SCREWS;  Surgeon: Maeola Harman, MD;  Location: [facility];  Service: Neurosurgery;  Laterality: Right;  RIGHT L3-4 L4-5 ANTEROLATERAL LUMBAR INTERBODY FUSION WITH PERCUTANEOUS PEDICLE SCREWS   BACK SURGERY     BALLOON DILATION N/A 04/08/2014   Procedure: BALLOON DILATION;  Surgeon: Malissa Hippo, MD;  Location: [facility];  Service: Endoscopy;  Laterality: N/A;   BLADDER SUSPENSION  2013   [facility]   CHOLECYSTECTOMY     ERCP N/A 04/08/2014   Procedure: ENDOSCOPIC RETROGRADE CHOLANGIOPANCREATOGRAPHY (ERCP);  Surgeon: Malissa Hippo, MD;  Location: [facility];  Service: Endoscopy;  Laterality: N/A;   LUMBAR PERCUTANEOUS PEDICLE SCREW 2 LEVEL Right 01/21/2017   Procedure: LUMBAR PERCUTANEOUS PEDICLE SCREW 2 LEVEL;  Surgeon: Maeola Harman, MD;  Location: [facility] OR;  Service: Neurosurgery;  Laterality: Right;   NASAL SEPTUM SURGERY  2013   [facility]   PANCREATIC STENT PLACEMENT N/A 04/08/2014   Procedure: PANCREATIC STENT PLACEMENT;  Surgeon: Malissa Hippo, MD;  Location: [facility];  Service: Endoscopy;  Laterality: N/A;   SPHINCTEROTOMY N/A 04/08/2014   Procedure: BILIARY SPHINCTEROTOMY;  Surgeon: Malissa Hippo, MD;  Location: [facility];  Service: Endoscopy;  Laterality: N/A;    Current Outpatient Medications  Medication Sig Dispense Refill   acetaminophen (TYLENOL) 500 MG  tablet Take 1,000 mg by mouth every 6 (six) hours as needed for mild pain or moderate pain.     ALPRAZolam (XANAX) 0.5 MG tablet Take 0.5 mg by mouth at bedtime. *May take twice daily as needed     atorvastatin (LIPITOR) 20 MG tablet Take 20 mg by mouth at bedtime.      escitalopram (LEXAPRO) 20 MG tablet Take 20 mg by mouth daily.      estradiol (ESTRACE) 0.5 MG tablet Take 0.5 mg by mouth every other day.     losartan (COZAAR) 50 MG tablet Take 50 mg  by mouth daily.     pantoprazole (PROTONIX) 40 MG tablet Take 40 mg by mouth daily.     traZODone (DESYREL) 100 MG tablet Take 100 mg by mouth 3 (three) times daily.      No current facility-administered medications for this visit.    Allergies as of 12/05/2022 - Review Complete 12/05/2022  Allergen Reaction Noted   No known allergies  01/20/2017    Family History  Problem Relation Age of Onset   Colon cancer Neg Hx    Colon polyps Neg Hx     Social History   Socioeconomic History   Marital status: Married    Spouse name: Not on file   Number of children: Not on file   Years of education: Not on file   Highest education level: Not on file  Occupational History   Not on file  Tobacco Use   Smoking status: Never   Smokeless tobacco: Never  Substance and Sexual Activity   Alcohol use: No   Drug use: No   Sexual activity: Not on file  Other Topics Concern   Not on file  Social History Narrative   Lives with husband, Lilou Kneip   Caffeine use: 1-2 cups coffee per day   Right handed    Social Determinants of Health   Financial Resource Strain: Not on file  Food Insecurity: Not on file  Transportation Needs: Not on file  Physical Activity: Not on file  Stress: Not on file  Social Connections: Not on file  Intimate Partner Violence: Not on file     Review of Systems   Gen: Denies any fever, chills, fatigue, weight loss, lack of appetite.  CV: Denies chest pain, heart palpitations, peripheral edema, syncope.  Resp: Denies shortness of breath at rest or with exertion. Denies wheezing or cough.  GI: see HPI GU : Denies urinary burning, urinary frequency, urinary hesitancy MS: Denies joint pain, muscle weakness, cramps, or limitation of movement.  Derm: Denies rash, itching, dry skin Psych: Denies depression, anxiety, memory loss, and confusion Heme: Denies bruising, bleeding, and enlarged lymph nodes.   Physical Exam   BP (!) 161/83 (BP Location: Right Arm,  Patient Position: Sitting, Cuff Size: Normal)   Pulse (!) 57   Temp (!) 97.2 F (36.2 C) (Temporal)   Ht 4\' 11"  (1.499 m)   Wt 134 lb (60.8 kg)   SpO2 98%   BMI 27.06 kg/m  General:   Alert and oriented. Pleasant and cooperative. Well-nourished and well-developed.  Head:  Normocephalic and atraumatic. Eyes:  Without icterus Ears:  Normal auditory acuity. Lungs:  Clear to auscultation bilaterally.  Heart:  S1, S2 present without murmurs appreciated.  Abdomen:  +BS, soft, non-tender and non-distended. No HSM noted. No guarding or rebound. No masses appreciated.  Rectal:  Deferred  Msk:  Symmetrical without gross deformities. Normal posture. Extremities:  Without edema. Neurologic:  Alert and  oriented x4;  grossly normal neurologically. Skin:  Intact without significant lesions or rashes. Psych:  Alert and cooperative. Normal mood and affect.   Assessment   Julie [name] is a 76 y.o. female presenting today  at the request of Romeo Rabon, MD due to decrease in appetite and abnormal CT abd/pelvis with contrast done 09/10/2022 showing wall thickening around the GEJ, differentials including reflux vs mass. She is also noting change in bowel habits with loose stools for the past 6 months and weight loss.   Thickened GE junction on CT: chronic GERD, on PPI daily. Notes chronic dysphagia. She has had multiple EGDs/dilations in the past by Dr. Samuella Cota. Last in 2017. BPE in 2018 with small sliding hiatal hernia with partial Schatzki's ring but no obstructing of tablet. 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 Needs EGD +/- dilation in near future.   Change in bowel habits: frequent stools for past 6 months. Decreased appetite. Last colonoscopy at least 10 years ago. No FH colon cancer or polyps. Doubt infectious as prolonged symptoms. Will arrange colonoscopy with random colonic biopsies as appropriate to assess, ?microscopic colitis, unable to exclude malignancy but less likely.    Pruritus: onset in August. No jaundice. Will check HFP. Unclear if this is related to an environmental factor or not; worsened with wearing clothes. Doesn't notice as much during the day.   The patient was found to have elevated blood pressure when vital signs were checked in the office. The blood pressure was rechecked by the nursing staff, and it was found to be persistently elevated >140/90 mmHg. I personally advised the patient to follow up closely with the PCP for hypertension control.     PLAN   Proceed with colonoscopy with random colonic biopsies as appropriate/EGD/dilation by Dr. Marletta Lor  in near future: the risks, benefits, and alternatives have been discussed with the patient in detail. The patient states understanding and desires to proceed.    Check HFP  Further recommendations to follow  Gelene Mink, PhD, ANP-BC [facility]

## 2022-12-05 NOTE — Patient Instructions (Addendum)
We are arranging a colonoscopy, upper endoscopy, and dilation in the near future!  Please have blood work done.  Further recommendations to follow!  Please follow-up with your primary care about your blood pressure!  Have a wonderful Christmas!  It was a pleasure to see you today. I want to create trusting relationships with patients to provide genuine, compassionate, and quality care. I value your feedback. If you receive a survey regarding your visit,  I greatly appreciate you taking time to fill this out.   Gelene Mink, PhD, ANP-BC [facility]

## 2022-12-05 NOTE — H&P (View-Only) (Signed)
    Gastroenterology Office Note    Referring Provider: Caplan, Michael, MD Primary Care Physician:  Caplan, Michael, MD     Chief Complaint   Chief Complaint  Patient presents with   decrease in appetite    Pt has a copy of pelvic scan that was done in [place].     History of Present Illness   Julie [name] is a 76 y.o. female presenting today at the request of Caplan, Michael, MD due to decrease in appetite and abnormal CT abd/pelvis with contrast done 09/10/2022 showing wall thickening around the GEJ, differentials including reflux vs mass.   She has had multiple EGDs/dilations in the past by Dr. Pandya. Last in 2017. BPE in 2018 with small sliding hiatal hernia with partial Schatzki's ring but no obstructing of tablet. 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   Since August, has noted early satiety. Was placed on mirtazapine per PCP. Her intake is better now than previously but still not large portions. Chronic dysphagia. Chews really well. Couldn't eat shredded wheat. Bread is difficult. Some burning in epigastric area at times.   History of diarrhea for the past 6 months. NO abx in past 6 months. Normal BMs prior to this. No rectal bleeding. Has had as many as 5 loose stools per day. No new medications. Sometimes has to go at night in middle of night.  Lost 20 lbs since August. Usually weighs around 147. Today 134. Last colonoscopy about 10 years ago by Dr. Spainhour. No polyps. History of multiple dilations in the past. A few Motrin daily due to back pain.   Notes diffuse pruritus since August. Worsened with wearing clothes. Throughout the day, she doesn't notice as much; however, at night it is worse. No jaundice.   Past Medical History:  Diagnosis Date   Depression    Dysphagia 06/16/2017   GERD (gastroesophageal reflux disease)    Headache    HTN (hypertension)    Hypercholesteremia     Past Surgical History:  Procedure Laterality Date   ABDOMINAL  HYSTERECTOMY     ANTERIOR LAT LUMBAR FUSION Right 01/21/2017   Procedure: RIGHT LUMBAR THREE-FOUR LUMBAR FOUR - FIVE ANTEROLATERAL LUMBAR INTERBODY FUSION WITH PERCUTANEOUS PEDICLE SCREWS;  Surgeon: Joseph Stern, MD;  Location: [facility];  Service: Neurosurgery;  Laterality: Right;  RIGHT L3-4 L4-5 ANTEROLATERAL LUMBAR INTERBODY FUSION WITH PERCUTANEOUS PEDICLE SCREWS   BACK SURGERY     BALLOON DILATION N/A 04/08/2014   Procedure: BALLOON DILATION;  Surgeon: Najeeb U Rehman, MD;  Location: [facility];  Service: Endoscopy;  Laterality: N/A;   BLADDER SUSPENSION  2013   [facility]   CHOLECYSTECTOMY     ERCP N/A 04/08/2014   Procedure: ENDOSCOPIC RETROGRADE CHOLANGIOPANCREATOGRAPHY (ERCP);  Surgeon: Najeeb U Rehman, MD;  Location: [facility];  Service: Endoscopy;  Laterality: N/A;   LUMBAR PERCUTANEOUS PEDICLE SCREW 2 LEVEL Right 01/21/2017   Procedure: LUMBAR PERCUTANEOUS PEDICLE SCREW 2 LEVEL;  Surgeon: Joseph Stern, MD;  Location: [facility] OR;  Service: Neurosurgery;  Laterality: Right;   NASAL SEPTUM SURGERY  2013   [facility]   PANCREATIC STENT PLACEMENT N/A 04/08/2014   Procedure: PANCREATIC STENT PLACEMENT;  Surgeon: Najeeb U Rehman, MD;  Location: [facility];  Service: Endoscopy;  Laterality: N/A;   SPHINCTEROTOMY N/A 04/08/2014   Procedure: BILIARY SPHINCTEROTOMY;  Surgeon: Najeeb U Rehman, MD;  Location: [facility];  Service: Endoscopy;  Laterality: N/A;    Current Outpatient Medications  Medication Sig Dispense Refill   acetaminophen (TYLENOL) 500 MG   tablet Take 1,000 mg by mouth every 6 (six) hours as needed for mild pain or moderate pain.     ALPRAZolam (XANAX) 0.5 MG tablet Take 0.5 mg by mouth at bedtime. *May take twice daily as needed     atorvastatin (LIPITOR) 20 MG tablet Take 20 mg by mouth at bedtime.      escitalopram (LEXAPRO) 20 MG tablet Take 20 mg by mouth daily.      estradiol (ESTRACE) 0.5 MG tablet Take 0.5 mg by mouth every other day.     losartan (COZAAR) 50 MG tablet Take 50 mg  by mouth daily.     pantoprazole (PROTONIX) 40 MG tablet Take 40 mg by mouth daily.     traZODone (DESYREL) 100 MG tablet Take 100 mg by mouth 3 (three) times daily.      No current facility-administered medications for this visit.    Allergies as of 12/05/2022 - Review Complete 12/05/2022  Allergen Reaction Noted   No known allergies  01/20/2017    Family History  Problem Relation Age of Onset   Colon cancer Neg Hx    Colon polyps Neg Hx     Social History   Socioeconomic History   Marital status: Married    Spouse name: Not on file   Number of children: Not on file   Years of education: Not on file   Highest education level: Not on file  Occupational History   Not on file  Tobacco Use   Smoking status: Never   Smokeless tobacco: Never  Substance and Sexual Activity   Alcohol use: No   Drug use: No   Sexual activity: Not on file  Other Topics Concern   Not on file  Social History Narrative   Lives with husband, Danny Poyser   Caffeine use: 1-2 cups coffee per day   Right handed    Social Determinants of Health   Financial Resource Strain: Not on file  Food Insecurity: Not on file  Transportation Needs: Not on file  Physical Activity: Not on file  Stress: Not on file  Social Connections: Not on file  Intimate Partner Violence: Not on file     Review of Systems   Gen: Denies any fever, chills, fatigue, weight loss, lack of appetite.  CV: Denies chest pain, heart palpitations, peripheral edema, syncope.  Resp: Denies shortness of breath at rest or with exertion. Denies wheezing or cough.  GI: see HPI GU : Denies urinary burning, urinary frequency, urinary hesitancy MS: Denies joint pain, muscle weakness, cramps, or limitation of movement.  Derm: Denies rash, itching, dry skin Psych: Denies depression, anxiety, memory loss, and confusion Heme: Denies bruising, bleeding, and enlarged lymph nodes.   Physical Exam   BP (!) 161/83 (BP Location: Right Arm,  Patient Position: Sitting, Cuff Size: Normal)   Pulse (!) 57   Temp (!) 97.2 F (36.2 C) (Temporal)   Ht 4' 11" (1.499 m)   Wt 134 lb (60.8 kg)   SpO2 98%   BMI 27.06 kg/m  General:   Alert and oriented. Pleasant and cooperative. Well-nourished and well-developed.  Head:  Normocephalic and atraumatic. Eyes:  Without icterus Ears:  Normal auditory acuity. Lungs:  Clear to auscultation bilaterally.  Heart:  S1, S2 present without murmurs appreciated.  Abdomen:  +BS, soft, non-tender and non-distended. No HSM noted. No guarding or rebound. No masses appreciated.  Rectal:  Deferred  Msk:  Symmetrical without gross deformities. Normal posture. Extremities:  Without edema. Neurologic:    Alert and  oriented x4;  grossly normal neurologically. Skin:  Intact without significant lesions or rashes. Psych:  Alert and cooperative. Normal mood and affect.   Assessment   Julie [name] is a 76 y.o. female presenting today  at the request of Romeo Rabon, MD due to decrease in appetite and abnormal CT abd/pelvis with contrast done 09/10/2022 showing wall thickening around the GEJ, differentials including reflux vs mass. She is also noting change in bowel habits with loose stools for the past 6 months and weight loss.   Thickened GE junction on CT: chronic GERD, on PPI daily. Notes chronic dysphagia. She has had multiple EGDs/dilations in the past by Dr. Samuella Cota. Last in 2017. BPE in 2018 with small sliding hiatal hernia with partial Schatzki's ring but no obstructing of tablet. 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 Needs EGD +/- dilation in near future.   Change in bowel habits: frequent stools for past 6 months. Decreased appetite. Last colonoscopy at least 10 years ago. No FH colon cancer or polyps. Doubt infectious as prolonged symptoms. Will arrange colonoscopy with random colonic biopsies as appropriate to assess, ?microscopic colitis, unable to exclude malignancy but less likely.    Pruritus: onset in August. No jaundice. Will check HFP. Unclear if this is related to an environmental factor or not; worsened with wearing clothes. Doesn't notice as much during the day.   The patient was found to have elevated blood pressure when vital signs were checked in the office. The blood pressure was rechecked by the nursing staff, and it was found to be persistently elevated >140/90 mmHg. I personally advised the patient to follow up closely with the PCP for hypertension control.     PLAN   Proceed with colonoscopy with random colonic biopsies as appropriate/EGD/dilation by Dr. Marletta Lor  in near future: the risks, benefits, and alternatives have been discussed with the patient in detail. The patient states understanding and desires to proceed.    Check HFP  Further recommendations to follow  Gelene Mink, PhD, ANP-BC [facility]

## 2022-12-06 ENCOUNTER — Encounter (HOSPITAL_COMMUNITY): Payer: Self-pay

## 2022-12-06 ENCOUNTER — Encounter (HOSPITAL_COMMUNITY)
Admission: RE | Admit: 2022-12-06 | Discharge: 2022-12-06 | Disposition: A | Discharge status: Home / Self Care | Payer: Medicare Other | Source: Ambulatory Visit | Attending: Internal Medicine | Admitting: Internal Medicine

## 2022-12-06 HISTORY — DX: Anxiety disorder, unspecified: F41.9

## 2022-12-06 LAB — SPECIMEN STATUS REPORT

## 2022-12-06 LAB — HEPATIC FUNCTION PANEL
ALT: 10 IU/L (ref 0–32)
Albumin: 4.1 g/dL (ref 3.8–4.8)
Alkaline Phosphatase: 91 IU/L (ref 44–121)

## 2022-12-07 LAB — HEPATIC FUNCTION PANEL
AST: 15 IU/L (ref 0–40)
Bilirubin Total: 0.3 mg/dL (ref 0.0–1.2)
Bilirubin, Direct: 0.11 mg/dL (ref 0.00–0.40)
Total Protein: 6 g/dL (ref 6.0–8.5)

## 2022-12-09 ENCOUNTER — Ambulatory Visit (HOSPITAL_COMMUNITY)
Admission: RE | Admit: 2022-12-09 | Discharge: 2022-12-09 | Disposition: A | Discharge status: Home / Self Care | Payer: Medicare Other | Source: Ambulatory Visit | Attending: Internal Medicine | Admitting: Internal Medicine

## 2022-12-09 ENCOUNTER — Ambulatory Visit (HOSPITAL_COMMUNITY): Payer: Medicare Other | Admitting: Anesthesiology

## 2022-12-09 ENCOUNTER — Encounter (HOSPITAL_COMMUNITY)
Admission: RE | Disposition: A | Discharge status: Home / Self Care | Payer: Self-pay | Source: Ambulatory Visit | Attending: Internal Medicine

## 2022-12-09 ENCOUNTER — Ambulatory Visit (HOSPITAL_BASED_OUTPATIENT_CLINIC_OR_DEPARTMENT_OTHER): Payer: Medicare Other | Admitting: Anesthesiology

## 2022-12-09 ENCOUNTER — Encounter (HOSPITAL_COMMUNITY): Payer: Self-pay

## 2022-12-09 DIAGNOSIS — K449 Diaphragmatic hernia without obstruction or gangrene: Secondary | ICD-10-CM

## 2022-12-09 DIAGNOSIS — R634 Abnormal weight loss: Secondary | ICD-10-CM | POA: Diagnosis not present

## 2022-12-09 DIAGNOSIS — I10 Essential (primary) hypertension: Secondary | ICD-10-CM | POA: Insufficient documentation

## 2022-12-09 DIAGNOSIS — K2970 Gastritis, unspecified, without bleeding: Secondary | ICD-10-CM

## 2022-12-09 DIAGNOSIS — F419 Anxiety disorder, unspecified: Secondary | ICD-10-CM | POA: Insufficient documentation

## 2022-12-09 DIAGNOSIS — K222 Esophageal obstruction: Secondary | ICD-10-CM

## 2022-12-09 DIAGNOSIS — K529 Noninfective gastroenteritis and colitis, unspecified: Secondary | ICD-10-CM

## 2022-12-09 DIAGNOSIS — K219 Gastro-esophageal reflux disease without esophagitis: Secondary | ICD-10-CM | POA: Insufficient documentation

## 2022-12-09 DIAGNOSIS — R6881 Early satiety: Secondary | ICD-10-CM | POA: Diagnosis not present

## 2022-12-09 DIAGNOSIS — Z6827 Body mass index (BMI) 27.0-27.9, adult: Secondary | ICD-10-CM | POA: Diagnosis not present

## 2022-12-09 DIAGNOSIS — K5730 Diverticulosis of large intestine without perforation or abscess without bleeding: Secondary | ICD-10-CM | POA: Diagnosis not present

## 2022-12-09 DIAGNOSIS — F32A Depression, unspecified: Secondary | ICD-10-CM | POA: Insufficient documentation

## 2022-12-09 DIAGNOSIS — R1310 Dysphagia, unspecified: Secondary | ICD-10-CM | POA: Diagnosis present

## 2022-12-09 HISTORY — PX: BIOPSY: SHX5522

## 2022-12-09 HISTORY — PX: COLONOSCOPY WITH PROPOFOL: SHX5780

## 2022-12-09 HISTORY — PX: ESOPHAGOGASTRODUODENOSCOPY (EGD) WITH PROPOFOL: SHX5813

## 2022-12-09 HISTORY — PX: BALLOON DILATION: SHX5330

## 2022-12-09 SURGERY — COLONOSCOPY WITH PROPOFOL
Anesthesia: General

## 2022-12-09 MED ORDER — PROPOFOL 10 MG/ML IV BOLUS
INTRAVENOUS | Status: DC | PRN
  Administered 2022-12-09: 100 mg via INTRAVENOUS

## 2022-12-09 MED ORDER — LACTATED RINGERS IV SOLN: INTRAVENOUS | Status: DC

## 2022-12-09 MED ORDER — LIDOCAINE HCL (CARDIAC) PF 100 MG/5ML IV SOSY
PREFILLED_SYRINGE | INTRAVENOUS | Status: DC | PRN
  Administered 2022-12-09: 50 mg via INTRATRACHEAL

## 2022-12-09 MED ORDER — PROPOFOL 500 MG/50ML IV EMUL
INTRAVENOUS | Status: DC | PRN
  Administered 2022-12-09: 150 ug/kg/min via INTRAVENOUS

## 2022-12-09 NOTE — Interval H&P Note (Signed)
History and Physical Interval Note:  12/09/2022 8:27 AM  Julie [name]  has presented today for surgery, with the diagnosis of diarrhea, dysphagia, wall thinking at GE junction.  The various methods of treatment have been discussed with the patient and family. After consideration of risks, benefits and other options for treatment, the patient has consented to  Procedure(s) with comments: COLONOSCOPY WITH PROPOFOL (N/A) - 9:30am, asa 2 ESOPHAGOGASTRODUODENOSCOPY (EGD) WITH PROPOFOL (N/A) BALLOON DILATION (N/A) as a surgical intervention.  The patient's history has been reviewed, patient examined, no change in status, stable for surgery.  I have reviewed the patient's chart and labs.  Questions were answered to the patient's satisfaction.     Lanelle Bal

## 2022-12-09 NOTE — Anesthesia Preprocedure Evaluation (Signed)
Anesthesia Evaluation  Patient identified by MRN, date of birth, ID band Patient awake    Reviewed: Allergy & Precautions, H&P , NPO status , Patient's Chart, lab work & pertinent test results, reviewed documented beta blocker date and time   Airway Mallampati: II  TM Distance: >3 FB Neck ROM: full    Dental no notable dental hx.    Pulmonary neg pulmonary ROS   Pulmonary exam normal breath sounds clear to auscultation       Cardiovascular Exercise Tolerance: Good hypertension, negative cardio ROS  Rhythm:regular Rate:Normal     Neuro/Psych  Headaches PSYCHIATRIC DISORDERS Anxiety Depression       GI/Hepatic Neg liver ROS,GERD  Medicated,,  Endo/Other  negative endocrine ROS    Renal/GU negative Renal ROS  negative genitourinary   Musculoskeletal   Abdominal   Peds  Hematology negative hematology ROS (+)   Anesthesia Other Findings   Reproductive/Obstetrics negative OB ROS                             Anesthesia Physical Anesthesia Plan  ASA: 2  Anesthesia Plan: General   Post-op Pain Management:    Induction:   PONV Risk Score and Plan: Propofol infusion  Airway Management Planned:   Additional Equipment:   Intra-op Plan:   Post-operative Plan:   Informed Consent: I have reviewed the patients History and Physical, chart, labs and discussed the procedure including the risks, benefits and alternatives for the proposed anesthesia with the patient or authorized representative who has indicated his/her understanding and acceptance.     Dental Advisory Given  Plan Discussed with: CRNA  Anesthesia Plan Comments:        Anesthesia Quick Evaluation

## 2022-12-09 NOTE — Anesthesia Postprocedure Evaluation (Signed)
Anesthesia Post Note  Patient: Julie [name]  Procedure(s) Performed: COLONOSCOPY WITH PROPOFOL ESOPHAGOGASTRODUODENOSCOPY (EGD) WITH PROPOFOL BALLOON DILATION BIOPSY  Patient location during evaluation: Phase II Anesthesia Type: General Level of consciousness: awake Pain management: pain level controlled Vital Signs Assessment: post-procedure vital signs reviewed and stable Respiratory status: spontaneous breathing and respiratory function stable Cardiovascular status: blood pressure returned to baseline and stable Postop Assessment: no headache and no apparent nausea or vomiting Anesthetic complications: no Comments: Late entry   No notable events documented.   Last Vitals:  Vitals:   12/09/22 0758 12/09/22 0902  BP: 139/63 (!) 94/35  Pulse: (!) 54 (!) 48  Resp: 10 12  Temp: 36.8 C   SpO2: 99% 95%    Last Pain:  Vitals:   12/09/22 0902  TempSrc: Oral  PainSc: 0-No pain                 Windell Norfolk

## 2022-12-09 NOTE — Discharge Instructions (Addendum)
EGD Discharge instructions Please read the instructions outlined below and refer to this sheet in the next few weeks. These discharge instructions provide you with general information on caring for yourself after you leave the hospital. Your doctor may also give you specific instructions. While your treatment has been planned according to the most current medical practices available, unavoidable complications occasionally occur. If you have any problems or questions after discharge, please call your doctor. ACTIVITY You may resume your regular activity but move at a slower pace for the next 24 hours.  Take frequent rest periods for the next 24 hours.  Walking will help expel (get rid of) the air and reduce the bloated feeling in your abdomen.  No driving for 24 hours (because of the anesthesia (medicine) used during the test).  You may shower.  Do not sign any important legal documents or operate any machinery for 24 hours (because of the anesthesia used during the test).  NUTRITION Drink plenty of fluids.  You may resume your normal diet.  Begin with a light meal and progress to your normal diet.  Avoid alcoholic beverages for 24 hours or as instructed by your caregiver.  MEDICATIONS You may resume your normal medications unless your caregiver tells you otherwise.  WHAT YOU CAN EXPECT TODAY You may experience abdominal discomfort such as a feeling of fullness or "gas" pains.  FOLLOW-UP Your doctor will discuss the results of your test with you.  SEEK IMMEDIATE MEDICAL ATTENTION IF ANY OF THE FOLLOWING OCCUR: Excessive nausea (feeling sick to your stomach) and/or vomiting.  Severe abdominal pain and distention (swelling).  Trouble swallowing.  Temperature over 101 F (37.8 C).  Rectal bleeding or vomiting of blood.     Colonoscopy Discharge Instructions  Read the instructions outlined below and refer to this sheet in the next few weeks. These discharge instructions provide you with  general information on caring for yourself after you leave the hospital. Your doctor may also give you specific instructions. While your treatment has been planned according to the most current medical practices available, unavoidable complications occasionally occur.   ACTIVITY You may resume your regular activity, but move at a slower pace for the next 24 hours.  Take frequent rest periods for the next 24 hours.  Walking will help get rid of the air and reduce the bloated feeling in your belly (abdomen).  No driving for 24 hours (because of the medicine (anesthesia) used during the test).   Do not sign any important legal documents or operate any machinery for 24 hours (because of the anesthesia used during the test).  NUTRITION Drink plenty of fluids.  You may resume your normal diet as instructed by your doctor.  Begin with a light meal and progress to your normal diet. Heavy or fried foods are harder to digest and may make you feel sick to your stomach (nauseated).  Avoid alcoholic beverages for 24 hours or as instructed.  MEDICATIONS You may resume your normal medications unless your doctor tells you otherwise.  WHAT YOU CAN EXPECT TODAY Some feelings of bloating in the abdomen.  Passage of more gas than usual.  Spotting of blood in your stool or on the toilet paper.  IF YOU HAD POLYPS REMOVED DURING THE COLONOSCOPY: No aspirin products for 7 days or as instructed.  No alcohol for 7 days or as instructed.  Eat a soft diet for the next 24 hours.  FINDING OUT THE RESULTS OF YOUR TEST Not all test results are  available during your visit. If your test results are not back during the visit, make an appointment with your caregiver to find out the results. Do not assume everything is normal if you have not heard from your caregiver or the medical facility. It is important for you to follow up on all of your test results.  SEEK IMMEDIATE MEDICAL ATTENTION IF: You have more than a spotting of  blood in your stool.  Your belly is swollen (abdominal distention).  You are nauseated or vomiting.  You have a temperature over 101.  You have abdominal pain or discomfort that is severe or gets worse throughout the day.   Your EGD revealed mild amount inflammation in your stomach.  I took biopsies of this to rule out infection with a bacteria called H. pylori.  Await pathology results, my office will contact you.  You have a small hiatal hernia.  He also have a stricture of the end portion of your esophagus called a Schatzki's ring.  I stressed this with a balloon today.  Hopefully this helps with your symptoms.  Continue on pantoprazole daily.  Small bowel appeared normal.  Overall your colon looked very healthy.  No active inflammation indicative of underlying inflammatory bowel disease such as Crohn's disease or ulcerative colitis.  I did take biopsies of your colon to rule out a condition called microscopic colitis which can cause chronically loose stools especially in women.  We will call with these results.  No polyps or evidence of colon cancer.  I do not think you need repeat colonoscopy for colon cancer screening purposes in the future.  Follow-up with GI in 3 months.  I hope you have a great rest of your week!  Hennie Duos. Marletta Lor, D.O. Gastroenterology and Hepatology [facility]

## 2022-12-09 NOTE — Op Note (Signed)
[facility] Patient Name: Julie [name] Procedure Date: 12/09/2022 8:32 AM MRN: [id number] Date of Birth: 1946-04-11 Attending MD: Elon Alas. Edgar Frisk, [id number] CSN: [id number] Age: 76 Admit Type: Outpatient Procedure:                Upper GI endoscopy Indications:              Dysphagia, Early satiety, Weight loss Providers:                Elon Alas. Abbey Chatters, DO, Crystal Page, Raphael Gibney,                            Technician Referring MD:              Medicines:                See the Anesthesia note for documentation of the                            administered medications Complications:            No immediate complications. Estimated Blood Loss:     Estimated blood loss was minimal. Procedure:                Pre-Anesthesia Assessment:                           - The anesthesia plan was to use monitored                            anesthesia care (MAC).                           After obtaining informed consent, the endoscope was                            passed under direct vision. Throughout the                            procedure, the patient's blood pressure, pulse, and                            oxygen saturations were monitored continuously. The                            GIF-H190 ([id number]) scope was introduced through the                            mouth, and advanced to the second part of duodenum.                            The upper GI endoscopy was accomplished without                            difficulty. The patient tolerated the procedure                            well. Scope In:  8:39:02 AM Scope Out: 8:44:01 AM Total Procedure Duration: 0 hours 4 minutes 59 seconds  Findings:      A 3 cm hiatal hernia was present.      A mild Schatzki ring was found in the distal esophagus. A TTS dilator       was passed through the scope. Dilation with an 18-19-20 mm balloon       dilator was performed to 20 mm. The dilation site was examined and       showed  mild mucosal disruption and moderate improvement in luminal       narrowing.      Patchy mild inflammation characterized by erythema was found in the       gastric body and in the gastric antrum. Biopsies were taken with a cold       forceps for Helicobacter pylori testing.      The duodenal bulb, first portion of the duodenum and second portion of       the duodenum were normal. Impression:               - 3 cm hiatal hernia.                           - Mild Schatzki ring. Dilated.                           - Gastritis. Biopsied.                           - Normal duodenal bulb, first portion of the                            duodenum and second portion of the duodenum. Moderate Sedation:      Per Anesthesia Care Recommendation:           - Patient has a contact number available for                            emergencies. The signs and symptoms of potential                            delayed complications were discussed with the                            patient. Return to normal activities tomorrow.                            Written discharge instructions were provided to the                            patient.                           - Resume previous diet.                           - Continue present medications.                           -  Await pathology results.                           - Repeat upper endoscopy PRN for retreatment.                           - Use Protonix (pantoprazole) 40 mg PO daily. Procedure Code(s):        --- Professional ---                           [phone], Esophagogastroduodenoscopy, flexible,                            transoral; with transendoscopic balloon dilation of                            esophagus (less than 30 mm diameter)                           43239, 59, Esophagogastroduodenoscopy, flexible,                            transoral; with biopsy, single or multiple Diagnosis Code(s):        --- Professional ---                            K44.9, Diaphragmatic hernia without obstruction or                            gangrene                           K22.2, Esophageal obstruction                           K29.70, Gastritis, unspecified, without bleeding                           R13.10, Dysphagia, unspecified                           R68.81, Early satiety                           R63.4, Abnormal weight loss CPT copyright 2022 American Medical Association. All rights reserved. The codes documented in this report are preliminary and upon coder review may  be revised to meet current compliance requirements. Elon Alas. Abbey Chatters, DO Julie [place], DO 12/09/2022 8:47:00 AM This report has been signed electronically. Number of Addenda: 0

## 2022-12-09 NOTE — Transfer of Care (Signed)
Immediate Anesthesia Transfer of Care Note  Patient: Julie [name]  Procedure(s) Performed: COLONOSCOPY WITH PROPOFOL ESOPHAGOGASTRODUODENOSCOPY (EGD) WITH PROPOFOL BALLOON DILATION BIOPSY  Patient Location: Short Stay  Anesthesia Type:General  Level of Consciousness: sedated and patient cooperative  Airway & Oxygen Therapy: Patient Spontanous Breathing and Patient connected to nasal cannula oxygen  Post-op Assessment: Report given to RN, Post -op Vital signs reviewed and stable, and Patient moving all extremities  Post vital signs: Reviewed and stable  Last Vitals:  Vitals Value Taken Time  BP    Temp    Pulse    Resp    SpO2      Last Pain:  Vitals:   12/09/22 0836  TempSrc:   PainSc: 0-No pain         Complications: No notable events documented.

## 2022-12-09 NOTE — Op Note (Signed)
[facility] Patient Name: Julie [name] Procedure Date: 12/09/2022 8:32 AM MRN: [id number] Date of Birth: Jan 28, 1946 Attending MD: Elon Alas. Edgar Frisk, [id number] CSN: [id number] Age: 76 Admit Type: Outpatient Procedure:                Colonoscopy Indications:              Chronic diarrhea, Weight loss Providers:                Elon Alas. Abbey Chatters, DO, Crystal Page, Raphael Gibney,                            Technician Referring MD:              Medicines:                See the Anesthesia note for documentation of the                            administered medications Complications:            No immediate complications. Estimated Blood Loss:     Estimated blood loss was minimal. Procedure:                Pre-Anesthesia Assessment:                           - The anesthesia plan was to use monitored                            anesthesia care (MAC).                           After obtaining informed consent, the colonoscope                            was passed under direct vision. Throughout the                            procedure, the patient's blood pressure, pulse, and                            oxygen saturations were monitored continuously. The                            [id number]-HQ190L ([id number]) scope was introduced through                            the anus and advanced to the the terminal ileum,                            with identification of the appendiceal orifice and                            IC valve. The colonoscopy was performed without                            difficulty. The patient tolerated the procedure  well. The quality of the bowel preparation was                            evaluated using the BBPS [facility] Bowel Preparation                            Scale) with scores of: Right Colon = 3, Transverse                            Colon = 3 and Left Colon = 3 (entire mucosa seen                            well with no residual  staining, small fragments of                            stool or opaque liquid). The total BBPS score                            equals 9. Scope In: 8:49:34 AM Scope Out: 8:58:03 AM Scope Withdrawal Time: 0 hours 7 minutes 38 seconds  Total Procedure Duration: 0 hours 8 minutes 29 seconds  Findings:      The perianal and digital rectal examinations were normal.      Multiple medium-mouthed and small-mouthed diverticula were found in the       sigmoid colon.      The terminal ileum appeared normal.      Biopsies for histology were taken with a cold forceps from the ascending       colon, transverse colon and descending colon for evaluation of       microscopic colitis.      The exam was otherwise without abnormality. Impression:               - Diverticulosis in the sigmoid colon.                           - The examined portion of the ileum was normal.                           - The examination was otherwise normal.                           - Biopsies were taken with a cold forceps from the                            ascending colon, transverse colon and descending                            colon for evaluation of microscopic colitis. Moderate Sedation:      Per Anesthesia Care Recommendation:           - Patient has a contact number available for                            emergencies. The signs and symptoms of potential  delayed complications were discussed with the                            patient. Return to normal activities tomorrow.                            Written discharge instructions were provided to the                            patient.                           - Resume previous diet.                           - Continue present medications.                           - Await pathology results.                           - No repeat colonoscopy due to age.                           - Return to GI clinic in 3 months. Procedure Code(s):         --- Professional ---                           [phone], Colonoscopy, flexible; with biopsy, single                            or multiple Diagnosis Code(s):        --- Professional ---                           K52.9, Noninfective gastroenteritis and colitis,                            unspecified                           R63.4, Abnormal weight loss                           K57.30, Diverticulosis of large intestine without                            perforation or abscess without bleeding CPT copyright 2022 American [employer]. All rights reserved. The codes documented in this report are preliminary and upon coder review may  be revised to meet current compliance requirements. Elon Alas. Abbey Chatters, DO [place] [name], DO 12/09/2022 9:00:55 AM This report has been signed electronically. Number of Addenda: 0

## 2022-12-10 LAB — SURGICAL PATHOLOGY

## 2022-12-16 ENCOUNTER — Encounter (HOSPITAL_COMMUNITY): Payer: Self-pay | Admitting: Internal Medicine

## 2023-03-11 ENCOUNTER — Ambulatory Visit: Payer: Medicare Other | Admitting: Gastroenterology

## 2023-04-14 ENCOUNTER — Other Ambulatory Visit: Payer: Self-pay | Admitting: Neurology

## 2023-04-15 NOTE — Telephone Encounter (Signed)
Last seen on 01/22/22 No 6 month follow up scheduled

## 2023-09-22 ENCOUNTER — Emergency Department (HOSPITAL_COMMUNITY): Payer: Medicare Other

## 2023-09-22 ENCOUNTER — Other Ambulatory Visit: Payer: Self-pay

## 2023-09-22 ENCOUNTER — Emergency Department (HOSPITAL_COMMUNITY)
Admission: EM | Admit: 2023-09-22 | Discharge: 2023-09-22 | Disposition: A | Discharge status: Home / Self Care | Payer: Medicare Other | Attending: Emergency Medicine | Admitting: Emergency Medicine

## 2023-09-22 ENCOUNTER — Encounter (HOSPITAL_COMMUNITY): Payer: Self-pay | Admitting: Emergency Medicine

## 2023-09-22 DIAGNOSIS — I10 Essential (primary) hypertension: Secondary | ICD-10-CM | POA: Diagnosis not present

## 2023-09-22 DIAGNOSIS — Z1152 Encounter for screening for COVID-19: Secondary | ICD-10-CM | POA: Diagnosis not present

## 2023-09-22 DIAGNOSIS — R300 Dysuria: Secondary | ICD-10-CM | POA: Insufficient documentation

## 2023-09-22 DIAGNOSIS — R531 Weakness: Secondary | ICD-10-CM | POA: Diagnosis present

## 2023-09-22 LAB — CBC
HCT: 44 % (ref 36.0–46.0)
Hemoglobin: 14 g/dL (ref 12.0–15.0)
MCH: 30.4 pg (ref 26.0–34.0)
MCHC: 31.8 g/dL (ref 30.0–36.0)
MCV: 95.7 fL (ref 80.0–100.0)
Platelets: 264 10*3/uL (ref 150–400)
RBC: 4.6 MIL/uL (ref 3.87–5.11)
RDW: 13.4 % (ref 11.5–15.5)
WBC: 10.4 10*3/uL (ref 4.0–10.5)
nRBC: 0 % (ref 0.0–0.2)

## 2023-09-22 LAB — URINALYSIS, ROUTINE W REFLEX MICROSCOPIC
Glucose, UA: 50 mg/dL — AB
Hgb urine dipstick: NEGATIVE
Ketones, ur: 5 mg/dL — AB
Nitrite: NEGATIVE
Protein, ur: 30 mg/dL — AB
Specific Gravity, Urine: 1.041 — ABNORMAL HIGH (ref 1.005–1.030)
pH: 5 (ref 5.0–8.0)

## 2023-09-22 LAB — BASIC METABOLIC PANEL
Anion gap: 8 (ref 5–15)
BUN: 20 mg/dL (ref 8–23)
CO2: 27 mmol/L (ref 22–32)
Calcium: 8.9 mg/dL (ref 8.9–10.3)
Chloride: 102 mmol/L (ref 98–111)
Creatinine, Ser: 0.86 mg/dL (ref 0.44–1.00)
GFR, Estimated: >60 mL/min (ref >60)
Glucose, Bld: 85 mg/dL (ref 70–99)
Potassium: 4.3 mmol/L (ref 3.5–5.1)
Sodium: 137 mmol/L (ref 135–145)

## 2023-09-22 LAB — CBG MONITORING, ED: Glucose-Capillary: 91 mg/dL (ref 70–99)

## 2023-09-22 LAB — SARS CORONAVIRUS 2 BY RT PCR: SARS Coronavirus 2 by RT PCR: NEGATIVE

## 2023-09-22 MED ORDER — CEPHALEXIN 500 MG PO CAPS
500.0000 mg | ORAL_CAPSULE | Freq: Once | ORAL | Status: AC
  Administered 2023-09-22: 500 mg via ORAL
  Filled 2023-09-22: qty 1

## 2023-09-22 MED ORDER — CEPHALEXIN 500 MG PO CAPS: 500.0000 mg | ORAL_CAPSULE | Freq: Four times a day (QID) | ORAL | 0 refills | Status: DC

## 2023-09-22 NOTE — Discharge Instructions (Addendum)
Take the antibiotics as directed.  Drink plenty of water.  I recommend that you follow-up with your primary care provider in 1 week for recheck.  Return emergency department for any new or worsening symptoms.

## 2023-09-22 NOTE — ED Provider Notes (Signed)
[place] EMERGENCY DEPARTMENT AT [facility] Provider Note   CSN: [id number] Arrival date & time: 09/22/23  1415     History  Chief Complaint  Patient presents with   Weakness    Julie [name] is a 77 y.o. female.   Weakness Associated symptoms: headaches   Associated symptoms: no abdominal pain, no arthralgias, no cough, no diarrhea, no dizziness, no dysuria, no fever, no myalgias, no nausea, no seizures, no shortness of breath and no vomiting         Julie [name] is a 77 y.o. female with past medical history of anxiety, depression, hypertension, GERD who presents to the Emergency Department complaining of generalized weakness x 3 weeks.  She endorses some nasal congestion and fatigue.  She was seen at urgent care at onset of her symptoms, prescribed antibiotics and steroid medication without improvement.  States COVID and flu test were not performed.  Denies any known fever or chills.  Tremulous but states she takes Xanax anxiety and this usually helps with her tremors.  Also describes frontal headache of gradual onset and symptoms began after vacationing.  Admits to mild burning with urination, no significant flank pain and states her urine appears dark.  Denies any visual changes, nausea, vomiting, diarrhea.  No shortness of breath or chest pain.   Home Medications Prior to Admission medications   Medication Sig Start Date End Date Taking? Authorizing Provider  acetaminophen (TYLENOL) 500 MG tablet Take 1,000 mg by mouth every 6 (six) hours as needed for mild pain or moderate pain.    [provider]  ALPRAZolam Prudy Feeler) 0.5 MG tablet Take 0.5 mg by mouth 2 (two) times daily. *May take twice daily as needed    [provider]  atorvastatin (LIPITOR) 20 MG tablet Take 20 mg by mouth at bedtime.     [provider]  Cholecalciferol (VITAMIN D3) 50 MCG (2000 UT) capsule Take 2,000 Units by mouth daily.    [provider]   escitalopram (LEXAPRO) 20 MG tablet Take 20 mg by mouth daily.     [provider]  estradiol (ESTRACE) 0.5 MG tablet Take 0.5 mg by mouth every other day.    [provider]  fluticasone (FLONASE) 50 MCG/ACT nasal spray Place 1 spray into both nostrils daily as needed for allergies.    [provider]  hydrochlorothiazide (HYDRODIURIL) 12.5 MG tablet Take 12.5 mg by mouth daily. 06/15/22   [provider]  hydrOXYzine (ATARAX) 10 MG tablet Take 10 mg by mouth 4 (four) times daily as needed for itching. 11/12/22   [provider]  pantoprazole (PROTONIX) 40 MG tablet Take 40 mg by mouth daily.    [provider]  traZODone (DESYREL) 100 MG tablet Take 200 mg by mouth at bedtime.    [provider]  triamcinolone cream (KENALOG) 0.1 % Apply 1 Application topically 2 (two) times daily.    [provider]      Allergies    No known allergies    Review of Systems   Review of Systems  Constitutional:  Positive for appetite change. Negative for chills and fever.  HENT:  Positive for congestion and sinus pressure. Negative for ear pain and sore throat.   Eyes:  Negative for visual disturbance.  Respiratory:  Negative for cough and shortness of breath.   Gastrointestinal:  Negative for abdominal pain, diarrhea, nausea and vomiting.  Genitourinary:  Negative for dysuria and flank pain.  Musculoskeletal:  Negative for arthralgias, myalgias, neck pain and neck stiffness.  Skin:  Negative for rash.  Neurological:  Positive for weakness and headaches. Negative for dizziness, seizures, syncope, speech difficulty and numbness.  Psychiatric/Behavioral:  Negative for confusion.     Physical Exam Updated Vital Signs BP 126/61   Pulse 75   Temp 98 F (36.7 C) (Oral)   Resp 16   Ht 5' (1.524 m)   Wt 60.3 kg   SpO2 97%   BMI 25.97 kg/m  Physical Exam Vitals and nursing note reviewed.  Constitutional:      General: She is  not in acute distress.    Appearance: Normal appearance. She is not ill-appearing or toxic-appearing.  HENT:     Right Ear: Tympanic membrane and ear canal normal.     Left Ear: Tympanic membrane and ear canal normal.     Mouth/Throat:     Mouth: Mucous membranes are moist.  Eyes:     Extraocular Movements: Extraocular movements intact.     Conjunctiva/sclera: Conjunctivae normal.     Pupils: Pupils are equal, round, and reactive to light.  Cardiovascular:     Rate and Rhythm: Normal rate and regular rhythm.     Pulses: Normal pulses.  Pulmonary:     Effort: Pulmonary effort is normal. No respiratory distress.  Chest:     Chest wall: No tenderness.  Abdominal:     Palpations: Abdomen is soft.     Tenderness: There is no abdominal tenderness.  Musculoskeletal:        General: Normal range of motion.     Cervical back: Normal range of motion. No tenderness.     Right lower leg: No edema.     Left lower leg: No edema.  Lymphadenopathy:     Cervical: No cervical adenopathy.  Skin:    General: Skin is warm.     Capillary Refill: Capillary refill takes less than 2 seconds.  Neurological:     General: No focal deficit present.     Mental Status: She is alert.     Sensory: No sensory deficit.     Motor: No weakness.     Comments: CN II through XII grossly intact.  Speech clear.  Mentating well.  No facial droop, no pronator drift     ED Results / Procedures / Treatments   Labs (all labs ordered are listed, but only abnormal results are displayed) Labs Reviewed  URINALYSIS, ROUTINE W REFLEX MICROSCOPIC - Abnormal; Notable for the following components:      Result Value   Color, Urine AMBER (*)    Specific Gravity, Urine 1.041 (*)    Glucose, UA 50 (*)    Bilirubin Urine SMALL (*)    Ketones, ur 5 (*)    Protein, ur 30 (*)    Leukocytes,Ua SMALL (*)    Bacteria, UA RARE (*)    All other components within normal limits  SARS CORONAVIRUS 2 BY RT PCR  URINE CULTURE  BASIC  METABOLIC PANEL  CBC  CBG MONITORING, ED    EKG EKG Interpretation Date/Time:  Monday September 22 2023 15:15:18 EDT Ventricular Rate:  60 PR Interval:  114 QRS Duration:  116 QT Interval:  426 QTC Calculation: 426 R Axis:   -47  Text Interpretation: Normal sinus rhythm 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 Atherosclerotic calcification of the aortic arch. The lungs appear clear. Cardiac and mediastinal contours normal. No blunting of the costophrenic angles. IMPRESSION: 1. Mildly low lung volumes. 2.  Aortic Atherosclerosis (ICD10-I70.0). Electronically Signed   By: Gaylyn Rong M.D.   On: 09/22/2023 17:51    Procedures Procedures    Medications Ordered in ED Medications - No data to display  ED Course/ Medical Decision Making/ A&P                                 Medical Decision Making Patient here for evaluation of generalized weakness and fatigue x 3 weeks.  Endorses frontal headache of gradual onset and sinus pressure with congestion.  Was treated at urgent care with steroids and antibiotics with no improvement of her symptoms.  States COVID and flu test were not performed.  Symptoms been associated with decreased appetite.   Diff dx includes but not limited to viral process, infectious, or cardiac process  Amount and/or Complexity of Data Reviewed Labs: ordered.    Details: Labs are interpreted by me, no leukocytosis, chemistries without derangement.  COVID test negative.  Urinalysis shows small leukocytes with 6-10 white cells and rare bacteria, possible contamination with 11-20 squamous cells, urine culture is  pending Radiology: ordered.    Details: Chest x-ray without acute finding ECG/medicine tests: ordered.    Details: EKG shows normal sinus rhythm with left anterior fascicular block left ventricular hypertrophy widening of the QRS Discussion of management or test interpretation with external provider(s): On recheck, patient resting comfortably.  Nontoxic-appearing.  Her vital signs are reassuring and she has been afebrile. Generalized weakness with mild dysuria symptoms, reported cough and sinus congestion.  Possible developing UTI, urine culture is pending, given patient has a urinary symptoms I think it is reasonable to start cephalexin.  She will follow-up with her PCP for recheck in a few days.  Also discussed possibility that since her symptoms have been present for 3 weeks that she may have had COVID and now having residual symptoms   Appears appropriate for d/c home.  Return precautions given  Risk Prescription drug management.           Final Clinical Impression(s) / ED Diagnoses Final diagnoses:  Generalized weakness  Dysuria    Rx / DC Orders ED Discharge Orders     None         Pauline Aus, PA-C 09/23/23 1358    Pricilla Loveless, MD 09/29/23 [phone]

## 2023-09-22 NOTE — ED Triage Notes (Signed)
Pt presents for evaluation of weakness x 3 weeks, saw a physician who put her antibiotics, unsure why, no labs performed.

## 2023-09-23 LAB — URINE CULTURE: Culture: NO GROWTH

## 2024-10-13 ENCOUNTER — Encounter (INDEPENDENT_AMBULATORY_CARE_PROVIDER_SITE_OTHER): Payer: Self-pay | Admitting: Gastroenterology

## 2024-10-28 ENCOUNTER — Ambulatory Visit: Admitting: Gastroenterology

## 2024-10-28 ENCOUNTER — Encounter: Payer: Self-pay | Admitting: Gastroenterology

## 2024-10-28 VITALS — BP 118/74 | HR 77 | Temp 98.1°F | Ht 59.0 in | Wt 134.2 lb

## 2024-10-28 DIAGNOSIS — K5900 Constipation, unspecified: Secondary | ICD-10-CM | POA: Diagnosis not present

## 2024-10-28 DIAGNOSIS — R002 Palpitations: Secondary | ICD-10-CM | POA: Diagnosis not present

## 2024-10-28 MED ORDER — LINACLOTIDE 145 MCG PO CAPS: 145.0000 ug | ORAL_CAPSULE | Freq: Every day | ORAL | Status: AC

## 2024-10-28 MED ORDER — POLYETHYLENE GLYCOL 3350 17 G PO PACK: 17.0000 g | PACK | Freq: Every day | ORAL | Status: AC

## 2024-10-28 NOTE — Patient Instructions (Addendum)
 I recommend taking pantoprazole  once each morning, 30 minutes before breakfast.   I have ordered labs for [facility], which you can do tomorrow.  When you have time to stay at home, I recommend the bowel purge. You will take 1 packet of Miralax  in 8 ounces of water  every hour for 6 hours (example: 8am, 9 am, 10, 11, 12, 1 final dose).   The following day, you will start the samples of Linzess. Linzess works best when taken once a day every day, on an empty stomach, at least 30 minutes before your first meal of the day.  When Linzess is taken daily as directed:  *Constipation relief is typically felt in about a week *IBS-C patients may begin to experience relief from belly pain and overall abdominal symptoms (pain, discomfort, and bloating) in about 1 week,   with symptoms typically improving over 12 weeks.  Diarrhea may occur in the first 2 weeks -keep taking it.  The diarrhea should go away and you should start having normal, complete, full bowel movements. It may be helpful to start treatment when you can be near the comfort of your own bathroom, such as a weekend.    I am referring you to cardiology due to palpitations!  We will see you in 6-8 weeks!  It was a pleasure to see you today. I want to create trusting relationships with patients and provide genuine, compassionate, and quality care. I truly value your feedback, so please be on the lookout for a survey regarding your visit with me today. I appreciate your time in completing this!         Therisa MICAEL Stager, PhD, ANP-BC [facility]

## 2024-10-28 NOTE — Progress Notes (Signed)
 Gastroenterology Office Note     Primary Care Physician:  Milana Sharper, MD  Primary Gastroenterologist: Dr. Cindie    Chief Complaint   Chief Complaint  Patient presents with   Hospitalization Follow-up    Having issues with constipation     History of Present Illness   Julie [name] is a 78 y.o. female presenting today due to concerns with constipation. She was last seen in Dec 2023. Hx of multiple prior EGDs/dilations, presenting now with constipation and concerns for GERD.    Was in the hospital for 4 days. At [facility]. Went to [facility] due to coughing. Oxygen level was low. Went to [facility] and was admitted 9/24-9/28 with acute hypoxic respiratory failure due to aspiration pneumonia,. . Records reviewed following visit noting esophagram with mild tertiary contractions, no stricture, small hiatal hernia, 13 mm tablet passing without difficulty. SLP BSE without concerns for aspiration.  CTA chest inpatient without acute PE, noted to have bronchial wall thickening, moderate sized hiatal hernia. GI evaluated and felt oropharyngeal dysfunction. She also had elevated troponin (65 ) felt secondary to demand ischemia. BNP was in the 2000s.   At discharge, Hgb was 11.5, Hct 35.5, platelets 173, creatiine 1.07, AST 20, ALT 11, alk phos 72  Very fatigued. Heart will race if she does something strenuous. No chest pain or SOB at rest but does note DOE. Appetite is not good. No dysphagia. No typical GERD symptoms. Pulmonology recommended PPI. She has not been taking.   Notes abdominal discomfort. BM small amount a few days ago. Having very small amounts of stool every day. Has had difficulty with constipation since discharge from the hospital. Prior to this would not have any issues. No rectal bleeding. Has taken Miralax  once daily, 3 in a row, without improvement. Other OTC agents as well. 10 years ago got very stopped up. No nausea/vomiting.    EGD Dec 2023: 3 cm hiatal hernia, mild  Schatzki ring s/p dilation, gastritis, normal duodenum, path negative H.pylori   Colonoscopy Dec 2023: sigmoid diverticulosis, examined normal TI, random colonic biopsies with path: negative for microscopic colitis.   No FH colon cancer or polyps Maternal/Paternal Grandfathers: MI No first degree relatives with heart disease  Past Medical History:  Diagnosis Date   Anxiety    Depression    Dysphagia 06/16/2017   GERD (gastroesophageal reflux disease)    Headache    HTN (hypertension)    Hypercholesteremia     Past Surgical History:  Procedure Laterality Date   ABDOMINAL HYSTERECTOMY     ANTERIOR LAT LUMBAR FUSION Right 01/21/2017   Procedure: RIGHT LUMBAR THREE-FOUR LUMBAR FOUR - FIVE ANTEROLATERAL LUMBAR INTERBODY FUSION WITH PERCUTANEOUS PEDICLE SCREWS;  Surgeon: Fairy Levels, MD;  Location: [facility];  Service: Neurosurgery;  Laterality: Right;  RIGHT L3-4 L4-5 ANTEROLATERAL LUMBAR INTERBODY FUSION WITH PERCUTANEOUS PEDICLE SCREWS   BACK SURGERY     BALLOON DILATION N/A 04/08/2014   Procedure: BALLOON DILATION;  Surgeon: Claudis RAYMOND Rivet, MD;  Location: [facility] OR[facility];  Service: Endoscopy;  Laterality: N/A;   BALLOON DILATION N/A 12/09/2022   Procedure: BALLOON DILATION;  Surgeon: Cindie Carlin POUR, DO;  [facility]: [facility];  Service: Endoscopy;  Laterality: N/A;   BIOPSY  12/09/2022   Procedure: BIOPSY;  Surgeon: Cindie Carlin POUR, DO;  [facility];  Service: Endoscopy;;   BLADDER SUSPENSION  2013   [facility]   CHOLECYSTECTOMY     COLONOSCOPY WITH PROPOFOL  N/A 12/09/2022   Procedure: COLONOSCOPY WITH  PROPOFOL ;  Surgeon: Cindie Carlin POUR, DO;  [facility];  Service: Endoscopy;  Laterality: N/A;  9:30am, asa 2   ERCP N/A 04/08/2014   Procedure: ENDOSCOPIC RETROGRADE CHOLANGIOPANCREATOGRAPHY (ERCP);  Surgeon: Claudis RAYMOND Rivet, MD;  Location: [facility];  Service: Endoscopy;  Laterality: N/A;   ESOPHAGOGASTRODUODENOSCOPY (EGD) WITH PROPOFOL  N/A 12/09/2022    Procedure: ESOPHAGOGASTRODUODENOSCOPY (EGD) WITH PROPOFOL ;  Surgeon: Cindie Carlin POUR, DO;  [facility];  Service: Endoscopy;  Laterality: N/A;   LUMBAR PERCUTANEOUS PEDICLE SCREW 2 LEVEL Right 01/21/2017   Procedure: LUMBAR PERCUTANEOUS PEDICLE SCREW 2 LEVEL;  Surgeon: Fairy Levels, MD;  Location: [facility] OR;  Service: Neurosurgery;  Laterality: Right;   NASAL SEPTUM SURGERY  2013   [facility]   PANCREATIC STENT PLACEMENT N/A 04/08/2014   Procedure: PANCREATIC STENT PLACEMENT;  Surgeon: Claudis RAYMOND Rivet, MD;  Location: [facility];  Service: Endoscopy;  Laterality: N/A;   SPHINCTEROTOMY N/A 04/08/2014   Procedure: BILIARY SPHINCTEROTOMY;  Surgeon: Claudis RAYMOND Rivet, MD;  Location: [facility];  Service: Endoscopy;  Laterality: N/A;    Current Outpatient Medications  Medication Sig Dispense Refill   acetaminophen  (TYLENOL ) 500 MG tablet Take 1,000 mg by mouth every 6 (six) hours as needed for mild pain or moderate pain.     ALPRAZolam  (XANAX ) 0.5 MG tablet Take 0.5 mg by mouth 2 (two) times daily. *May take twice daily as needed     atorvastatin  (LIPITOR) 20 MG tablet Take 20 mg by mouth at bedtime.      escitalopram  (LEXAPRO ) 20 MG tablet Take 20 mg by mouth daily.      estradiol  (ESTRACE ) 0.5 MG tablet Take 0.5 mg by mouth every other day.     fluticasone  (FLONASE ) 50 MCG/ACT nasal spray Place 1 spray into both nostrils daily as needed for allergies.     hydrochlorothiazide (HYDRODIURIL) 12.5 MG tablet Take 12.5 mg by mouth daily.     pantoprazole  (PROTONIX ) 40 MG tablet Take 40 mg by mouth daily.     traZODone  (DESYREL ) 100 MG tablet Take 200 mg by mouth at bedtime.     gabapentin (NEURONTIN) 300 MG capsule Take 300 mg by mouth daily.     ipratropium (ATROVENT) 0.02 % nebulizer solution Take 0.25 mg by nebulization every 4 (four) hours as needed.     losartan (COZAAR) 100 MG tablet Take 100 mg by mouth daily.     No current facility-administered medications for this visit.    Allergies  as of 10/28/2024 - Review Complete 10/28/2024  Allergen Reaction Noted   Hydrochlorothiazide  10/28/2024   Mirtazapine  07/31/2023   No known allergies  01/20/2017   Amlodipine  10/28/2024    Family History  Problem Relation Age of Onset   Colon cancer Neg Hx    Colon polyps Neg Hx     Social History   Socioeconomic History   Marital status: Married    Spouse name: Not on file   Number of children: Not on file   Years of education: Not on file   Highest education level: Not on file  Occupational History   Not on file  Tobacco Use   Smoking status: Never   Smokeless tobacco: Never  Substance and Sexual Activity   Alcohol use: No   Drug use: No   Sexual activity: Not on file  Other Topics Concern   Not on file  Social History Narrative   Lives with husband, Syniyah Bourne   Caffeine use: 1-2 cups coffee  per day   Right handed    Social Drivers of Health   Financial Resource Strain: Not on file  Food Insecurity: Not on file  Transportation Needs: Not on file  Physical Activity: Not on file  Stress: Not on file  Social Connections: Not on file  Intimate Partner Violence: Not on file     Review of Systems   Gen: Denies any fever, chills, fatigue, weight loss, lack of appetite.  CV: Denies chest pain, heart palpitations, peripheral edema, syncope.  Resp: Denies shortness of breath at rest or with exertion. Denies wheezing or cough.  GI: Denies dysphagia or odynophagia. Denies jaundice, hematemesis, fecal incontinence. GU : Denies urinary burning, urinary frequency, urinary hesitancy MS: Denies joint pain, muscle weakness, cramps, or limitation of movement.  Derm: Denies rash, itching, dry skin Psych: Denies depression, anxiety, memory loss, and confusion Heme: Denies bruising, bleeding, and enlarged lymph nodes.   Physical Exam   BP 118/74 (BP Location: Right Arm, Patient Position: Sitting, Cuff Size: Normal)   Pulse 77   Temp 98.1 F (36.7 C) (Oral)   Ht 4'  11 (1.499 m)   Wt 134 lb 3.2 oz (60.9 kg)   SpO2 95%   BMI 27.11 kg/m  General:   Alert and oriented. Pleasant and cooperative. Well-nourished and well-developed.  Head:  Normocephalic and atraumatic. Eyes:  Without icterus Abdomen:  +BS, soft, non-tender and non-distended. No HSM noted. No guarding or rebound. No masses appreciated.  Rectal:  no mass, very soft stool proximally, no impaction Msk:  Symmetrical without gross deformities. Normal posture. Extremities:  Without edema. Neurologic:  Alert and  oriented x4;  grossly normal neurologically. Skin:  Intact without significant lesions or rashes. Psych:  Alert and cooperative. Normal mood and affect.   Assessment   ALANE HANSSEN is a 78 y.o. female presenting today with concerns for constipation, possible GERD, and recently inpatient at [facility] with acute hypoxic respiratory failure due to aspiration pneumonia.   Constipation: onset following hospitalization. No mass on DRE or impaction. Check TSH, labs, Miralax  bowel purge, then start Linzess 145 mcg daily. Colonoscopy on file 2023.   Fatigue: check TSH, CBC, CMP, anemia panel. Notes palpitations with anything strenuous, DOE, no chest pain. Establish with pulmonary. I note BNP elevated while inpatient, required IV diuresis, no cardiology established currently. Will refer.   Hx of moderate hiatal hernia, could be contributing to silent reflux, aspiration. No esophageal dysphagia. Start PPI.     PLAN   Miralax  purge today, then start Linzess 145 mcg daily Pantoprazole  once daily CBC, CMP, TSH, anemia panel Referral to cardiology due to palpitations Close follow-up 6-8 weeks   Julie MICAEL Stager, PhD, ANP-BC [facility] Gastroenterology

## 2024-10-28 NOTE — Progress Notes (Signed)
 Medication Samples have been provided to the patient.  Drug name: MIRALAX        Strength: N/A        Qty: 23  LOT: [id number]  Exp.Date: 04/28/2026  Dosing instructions: AS DIRECTED  The patient has been instructed regarding the correct time, dose, and frequency of taking this medication, including desired effects and most common side effects.   Julie [name] 4:50 PM 10/28/2024

## 2024-10-28 NOTE — Progress Notes (Signed)
 Medication Samples have been provided to the patient.  Drug name: VESTA       Strength: 145 mcg        Qty: 3  LOT: [id number]  Exp.Date: 03/29/2025  Dosing instructions: Take one capsule daily 30 mins before breakfast.  The patient has been instructed regarding the correct time, dose, and frequency of taking this medication, including desired effects and most common side effects.   Julie [name] 4:51 PM 10/28/2024

## 2024-10-29 ENCOUNTER — Other Ambulatory Visit: Payer: Self-pay | Admitting: *Deleted

## 2024-10-29 DIAGNOSIS — R000 Tachycardia, unspecified: Secondary | ICD-10-CM

## 2024-10-29 DIAGNOSIS — R0609 Other forms of dyspnea: Secondary | ICD-10-CM

## 2024-11-24 ENCOUNTER — Encounter: Payer: Self-pay | Admitting: Gastroenterology

## 2024-12-28 ENCOUNTER — Encounter: Payer: Self-pay | Admitting: Gastroenterology

## 2024-12-28 ENCOUNTER — Other Ambulatory Visit: Payer: Self-pay

## 2024-12-28 ENCOUNTER — Ambulatory Visit: Admitting: Gastroenterology

## 2024-12-28 VITALS — BP 111/68 | HR 69 | Temp 97.6°F | Ht 59.0 in | Wt 130.2 lb

## 2024-12-28 DIAGNOSIS — K5900 Constipation, unspecified: Secondary | ICD-10-CM

## 2024-12-28 DIAGNOSIS — D538 Other specified nutritional anemias: Secondary | ICD-10-CM

## 2024-12-28 DIAGNOSIS — R634 Abnormal weight loss: Secondary | ICD-10-CM | POA: Diagnosis not present

## 2024-12-28 DIAGNOSIS — R630 Anorexia: Secondary | ICD-10-CM | POA: Diagnosis not present

## 2024-12-28 DIAGNOSIS — Z8719 Personal history of other diseases of the digestive system: Secondary | ICD-10-CM | POA: Diagnosis not present

## 2024-12-28 DIAGNOSIS — R5383 Other fatigue: Secondary | ICD-10-CM | POA: Diagnosis not present

## 2024-12-28 MED ORDER — PANTOPRAZOLE SODIUM 40 MG PO TBEC: 40.0000 mg | DELAYED_RELEASE_TABLET | Freq: Every day | ORAL | 3 refills | Status: AC

## 2024-12-28 MED ORDER — LINACLOTIDE 145 MCG PO CAPS: 145.0000 ug | ORAL_CAPSULE | Freq: Every day | ORAL | Status: AC

## 2024-12-28 NOTE — Progress Notes (Signed)
 "   Gastroenterology Office Note     Primary Care Physician:  Milana Sharper, MD  Primary Gastroenterologist: Cindie   Chief Complaint   Chief Complaint  Patient presents with   Follow-up    Follow up. Still having problems with constipations      History of Present Illness   Julie [name] is a 78 y.o. female presenting today with a history of chronic GERD, constipation, multiple prior dilations in the past for dysphagia, inpatient at [facility] earlier this year with acute hypoxic respiratory failure due to aspiration pneumonia, last seen in Oct 2025 with constipation following hospitalization.   At last visit, No mass on DRE or impaction. She was recommeneded to do Miralax  purge then start Linzess  145 mcg daily. Feels like she emptied out well after the purge. She doesn't remember taking the linzess .   Currently she is using Miralax  once in awhile. Currently having a BM 4 days out of the week. Still feels not emptying completely. Coffee helps her first thing in the morning. Abdomen feels better after emptying out.   Denies any GERD symptoms currently. She will have lower abdominal pain but improved after having a good BM. Appetite has not been good for 2 years.   Weight is 130 today. Oct 2025 134 2024 134 2023 141   Se was also referred to cardiology due to palpitations. Upcoming appt on Jan 15th.   CBC, CMP, TSH, anemia panel had been ordered but not completed yet.   She is unsure if she is taking pantoprazole  or not.     EGD Dec 2023: 3 cm hiatal hernia, mild Schatzki ring s/p dilation, gastritis, normal duodenum, path negative H.pylori    Colonoscopy Dec 2023: sigmoid diverticulosis, examined normal TI, random colonic biopsies with path: negative for microscopic colitis.    No FH colon cancer or polyps Maternal/Paternal Grandfathers: MI No first degree relatives with heart disease  Past Medical History:  Diagnosis Date   Anxiety    Depression     Dysphagia 06/16/2017   GERD (gastroesophageal reflux disease)    Headache    HTN (hypertension)    Hypercholesteremia     Past Surgical History:  Procedure Laterality Date   ABDOMINAL HYSTERECTOMY     ANTERIOR LAT LUMBAR FUSION Right 01/21/2017   Procedure: RIGHT LUMBAR THREE-FOUR LUMBAR FOUR - FIVE ANTEROLATERAL LUMBAR INTERBODY FUSION WITH PERCUTANEOUS PEDICLE SCREWS;  Surgeon: Fairy Levels, MD;  Location: [facility] OR;  Service: Neurosurgery;  Laterality: Right;  RIGHT L3-4 L4-5 ANTEROLATERAL LUMBAR INTERBODY FUSION WITH PERCUTANEOUS PEDICLE SCREWS   BACK SURGERY     BALLOON DILATION N/A 04/08/2014   Procedure: BALLOON DILATION;  Surgeon: Claudis RAYMOND Rivet, MD;  Location: [facility];  Service: Endoscopy;  Laterality: N/A;   BALLOON DILATION N/A 12/09/2022   Procedure: BALLOON DILATION;  Surgeon: Cindie Carlin POUR, DO;  [facility];  Service: Endoscopy;  Laterality: N/A;   BIOPSY  12/09/2022   Procedure: BIOPSY;  Surgeon: Cindie Carlin POUR, DO;  [facility];  Service: Endoscopy;;   BLADDER SUSPENSION  2013   [facility]   CHOLECYSTECTOMY     COLONOSCOPY WITH PROPOFOL  N/A 12/09/2022   Procedure: COLONOSCOPY WITH PROPOFOL ;  Surgeon: Cindie Carlin POUR, DO;  [facility];  Service: Endoscopy;  Laterality: N/A;  9:30am, asa 2   ERCP N/A 04/08/2014   Procedure: ENDOSCOPIC RETROGRADE CHOLANGIOPANCREATOGRAPHY (ERCP);  Surgeon: Claudis RAYMOND Rivet, MD;  Location: [facility];  Service: Endoscopy;  Laterality: N/A;  ESOPHAGOGASTRODUODENOSCOPY (EGD) WITH PROPOFOL  N/A 12/09/2022   Procedure: ESOPHAGOGASTRODUODENOSCOPY (EGD) WITH PROPOFOL ;  Surgeon: Cindie Carlin POUR, DO;  [facility];  Service: Endoscopy;  Laterality: N/A;   LUMBAR PERCUTANEOUS PEDICLE SCREW 2 LEVEL Right 01/21/2017   Procedure: LUMBAR PERCUTANEOUS PEDICLE SCREW 2 LEVEL;  Surgeon: Fairy Levels, MD;  Location: [facility] OR;  Service: Neurosurgery;  Laterality: Right;   NASAL SEPTUM SURGERY  2013   [facility]   PANCREATIC STENT PLACEMENT N/A 04/08/2014   Procedure: PANCREATIC STENT PLACEMENT;  Surgeon: Claudis RAYMOND Rivet, MD;  Location: [facility];  Service: Endoscopy;  Laterality: N/A;   SPHINCTEROTOMY N/A 04/08/2014   Procedure: BILIARY SPHINCTEROTOMY;  Surgeon: Claudis RAYMOND Rivet, MD;  Location: [facility];  Service: Endoscopy;  Laterality: N/A;    Current Outpatient Medications  Medication Sig Dispense Refill   acetaminophen  (TYLENOL ) 500 MG tablet Take 1,000 mg by mouth every 6 (six) hours as needed for mild pain or moderate pain.     ALPRAZolam  (XANAX ) 0.5 MG tablet Take 0.5 mg by mouth 2 (two) times daily. *May take twice daily as needed     atorvastatin  (LIPITOR) 20 MG tablet Take 20 mg by mouth at bedtime.      escitalopram  (LEXAPRO ) 20 MG tablet Take 20 mg by mouth daily.      estradiol  (ESTRACE ) 0.5 MG tablet Take 0.5 mg by mouth every other day.     fluticasone  (FLONASE ) 50 MCG/ACT nasal spray Place 1 spray into both nostrils daily as needed for allergies.     gabapentin (NEURONTIN) 300 MG capsule Take 300 mg by mouth daily.     hydrochlorothiazide (HYDRODIURIL) 12.5 MG tablet Take 12.5 mg by mouth daily.     linaclotide  (LINZESS ) 145 MCG CAPS capsule Take 1 capsule (145 mcg total) by mouth daily before breakfast.     losartan (COZAAR) 100 MG tablet Take 100 mg by mouth daily.     pantoprazole  (PROTONIX ) 40 MG tablet Take 40 mg by mouth daily.     polyethylene glycol (MIRALAX ) 17 g packet Take 17 g by mouth daily.     traZODone  (DESYREL ) 100 MG tablet Take 200 mg by mouth at bedtime.     ipratropium (ATROVENT) 0.02 % nebulizer solution Take 0.25 mg by nebulization every 4 (four) hours as needed. (Patient not taking: Reported on 12/28/2024)     No current facility-administered medications for this visit.    Allergies as of 12/28/2024 - Review Complete 12/28/2024  Allergen Reaction Noted   Hydrochlorothiazide  10/28/2024   Mirtazapine  07/31/2023   No known allergies  01/20/2017    Amlodipine  10/28/2024    Family History  Problem Relation Age of Onset   Colon cancer Neg Hx    Colon polyps Neg Hx     Social History   Socioeconomic History   Marital status: Married    Spouse name: Not on file   Number of children: Not on file   Years of education: Not on file   Highest education level: Not on file  Occupational History   Not on file  Tobacco Use   Smoking status: Never   Smokeless tobacco: Never  Substance and Sexual Activity   Alcohol use: No   Drug use: No   Sexual activity: Not on file  Other Topics Concern   Not on file  Social History Narrative   Lives with husband, Julie [name]   Caffeine use: 1-2 cups coffee per day   Right handed  Social Drivers of Health   Tobacco Use: Low Risk (12/28/2024)   Patient History    Smoking Tobacco Use: Never    Smokeless Tobacco Use: Never    Passive Exposure: Not on file  Financial Resource Strain: Not on file  Food Insecurity: Not on file  Transportation Needs: Not on file  Physical Activity: Not on file  Stress: Not on file  Social Connections: Not on file  Intimate Partner Violence: Not on file  Depression (EYV7-0): Not on file  Alcohol Screen: Not on file  Housing: Not on file  Utilities: Not on file  Health Literacy: Not on file     Review of Systems   See HPI   Physical Exam   BP 111/68 (BP Location: Right Arm, Patient Position: Sitting, Cuff Size: Normal)   Pulse 69   Temp 97.6 F (36.4 C) (Temporal)   Ht 4' 11 (1.499 m)   Wt 130 lb 3.2 oz (59.1 kg)   BMI 26.30 kg/m  General:   Alert and oriented. Pleasant and cooperative. Well-nourished and well-developed.  Head:  Normocephalic and atraumatic. Eyes:  Without icterus Abdomen:  +BS, soft, non-tender and non-distended. No HSM noted. No guarding or rebound. No masses appreciated.  Rectal:  Deferred  Msk:  Symmetrical without gross deformities. Normal posture. Extremities:  Without edema. Neurologic:  Alert and  oriented x4;   grossly normal neurologically. Skin:  Intact without significant lesions or rashes. Psych:  Alert and cooperative. Normal mood and affect.  Esophagram Sept 2025 at [facility]: mild tertiary contractions, no stricture, small hiatal hernia, 13 mm tablet passing without difficulty. SLP BSE without concerns for aspiration.  CTA chest inpatient without acute PE, noted to have bronchial wall thickening, moderate sized hiatal hernia. GI evaluated and felt oropharyngeal dysfunction. She also had elevated troponin (65 ) felt secondary to demand ischemia. BNP was in the 2000s.   At discharge Sept 2025 Hgb was 11.5, Hct 35.5, platelets 173, creatiine 1.07, AST 20, ALT 11, alk phos 72   Assessment   Julie [name] is a 78 y.o. female presenting today with a history of chronic GERD, constipation, multiple prior dilations in the past for dysphagia, inpatient at [facility] earlier this year with acute hypoxic respiratory failure due to aspiration pneumonia, last seen in Oct 2025 with constipation following hospitalization.  Constipation not ideally managed, as she was not aware she had Linzess  samples to take. Improved following Miralax  purge. Trial Linzess  145 mcg daily.   Fatigue: previously in Oct 2025 recommended labs but she forgot to do this. I have ordered this again as below. Palpitations chronically and esablishing care with cardiology next month.   Hx of moderate hiatal hernia: could be contributing to silent reflux, aspiration, no dysphagia. I have recommended she resume PPI as she is unsure if taking this or not.  Weight loss: with poor appetite. May need imaging if persists. Down 4 lbs from October 2025 (134>130). She was 130 last year but prior to this several years ago in the 140s. Notes poor appetite several years. Low threshold for CT. Depending on labs and if further weight loss, endoscopic procedures as well. Last colonoscopy/EGD fairly recent in 2023.       PLAN    Linzess  145 mcg  daily, consider Amitiza if no improvement after titrating CBC, CMP, TSH, anemia panel Keep upcoming appt with cardiology. Hx of elevated BNP, required IV diuresis earlier this year, no current cardiologist.  Pantoprazole  daily CT if persistent weight loss If IDA,  needs colon/EGD.  Close follow-up in 6-8 weeks    Therisa MICAEL Stager, PhD, [facility] Gastroenterology    "

## 2024-12-28 NOTE — Patient Instructions (Addendum)
 Please have blood work done fasting at [facility].  For constipation: Let's start Linzess  one capsule each morning, 30 minutes before breakfast daily. You may have some looser stool starting out, but it should get better in 4-5 days. If not, please let me know. We can increase or decrease this if needed. We can also try a different medication.  I recommend taking pantoprazole  daily. This is for reflux. You have a history of needing your esophagus stretched, so this can help decrease that risk.   We will see you in 6-8 weeks! Please call me if no improvement!  I enjoyed seeing you again today! I value our relationship and want to provide genuine, compassionate, and quality care. You may receive a survey regarding your visit with me, and I welcome your feedback! Thanks so much for taking the time to complete this. I look forward to seeing you again.      Therisa MICAEL Stager, PhD, ANP-BC [facility] Gastroenterology

## 2024-12-28 NOTE — Progress Notes (Signed)
 Medication Samples have been provided to the patient.  Drug name: LINZESS        Strength:        Qty: 3 BOXES  LOT: [id number]  Exp.Date: 2026/03  Dosing instructions: 1 CAP 30 MINUTES PRIOR TO BREAKSFAST  The patient has been instructed regarding the correct time, dose, and frequency of taking this medication, including desired effects and most common side effects.   Sontee Desena I Ravinder Lukehart 3:01 PM 12/28/2024

## 2025-01-10 ENCOUNTER — Telehealth: Payer: Self-pay

## 2025-01-10 NOTE — Telephone Encounter (Signed)
 Yes you can send it in, thanks!

## 2025-01-10 NOTE — Telephone Encounter (Signed)
 Returned the pt's call and was advised by her that the Linzess  is working for her. She would like a formal Rx sent to her pharmacy for this. Please advise or I can send. Please advise

## 2025-01-11 ENCOUNTER — Other Ambulatory Visit: Payer: Self-pay

## 2025-01-11 DIAGNOSIS — K5900 Constipation, unspecified: Secondary | ICD-10-CM

## 2025-01-11 MED ORDER — LINACLOTIDE 145 MCG PO CAPS: 145.0000 ug | ORAL_CAPSULE | Freq: Every day | ORAL | 3 refills | Status: AC

## 2025-01-11 NOTE — Progress Notes (Signed)
 error

## 2025-01-11 NOTE — Telephone Encounter (Signed)
 Pt's Rx for Linzess  has been sent in to her pharmacy.

## 2025-01-12 ENCOUNTER — Ambulatory Visit: Attending: Cardiology | Admitting: Cardiology

## 2025-01-12 ENCOUNTER — Encounter: Payer: Self-pay | Admitting: Cardiology

## 2025-01-12 ENCOUNTER — Other Ambulatory Visit: Payer: Self-pay | Admitting: Cardiology

## 2025-01-12 ENCOUNTER — Ambulatory Visit

## 2025-01-12 ENCOUNTER — Encounter: Payer: Self-pay | Admitting: *Deleted

## 2025-01-12 VITALS — BP 110/70 | HR 54 | Ht 59.0 in | Wt 129.4 lb

## 2025-01-12 DIAGNOSIS — R002 Palpitations: Secondary | ICD-10-CM

## 2025-01-12 DIAGNOSIS — R000 Tachycardia, unspecified: Secondary | ICD-10-CM | POA: Insufficient documentation

## 2025-01-12 NOTE — Patient Instructions (Addendum)
 Medication Instructions:   Continue all current medications.   Labwork:  none  Testing/Procedures:  Your physician has recommended that you wear a 7 day event monitor. Event monitors are medical devices that record the heart's electrical activity. Doctors most often us  these monitors to diagnose arrhythmias. Arrhythmias are problems with the speed or rhythm of the heartbeat. The monitor is a small, portable device. You can wear one while you do your normal daily activities. This is usually used to diagnose what is causing palpitations/syncope (passing out).  Office will contact with results via phone, letter or mychart.     Follow-Up:  2 months   Any Other Special Instructions Will Be Listed Below (If Applicable).   If you need a refill on your cardiac medications before your next appointment, please call your pharmacy.

## 2025-01-12 NOTE — Progress Notes (Signed)
 "     Clinical Summary Julie [name] is a 79 y.o.female seen today as a new consult for the following medical problems.  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 For example vaccuuming for about 1 hour. Denies chest pain, denies SOB. Resolves after 3-4 minutres - coffee x 1 cup, diet coke bottle x 1, occasional tea, no energy drinks. No EtoH - denies symptoms at rest    Past Medical History:  Diagnosis Date   Anxiety    Depression    Dysphagia 06/16/2017   GERD (gastroesophageal reflux disease)    Headache    HTN (hypertension)    Hypercholesteremia      Allergies[1]   Current Outpatient Medications  Medication Sig Dispense Refill   acetaminophen  (TYLENOL ) 500 MG tablet Take 1,000 mg by mouth every 6 (six) hours as needed for mild pain or moderate pain.     ALPRAZolam  (XANAX ) 0.5 MG tablet Take 0.5 mg by mouth 2 (two) times daily. *May take twice daily as needed     atorvastatin  (LIPITOR) 20 MG tablet Take 20 mg by mouth at bedtime.      escitalopram  (LEXAPRO ) 20 MG tablet Take 20 mg by mouth daily.      estradiol  (ESTRACE ) 0.5 MG tablet Take 0.5 mg by mouth every other day.     fluticasone  (FLONASE ) 50 MCG/ACT nasal spray Place 1 spray into both nostrils daily as needed for allergies.     gabapentin (NEURONTIN) 300 MG capsule Take 300 mg by mouth daily.     hydrochlorothiazide (HYDRODIURIL) 12.5 MG tablet Take 12.5 mg by mouth daily.     ipratropium (ATROVENT) 0.02 % nebulizer solution Take 0.25 mg by nebulization every 4 (four) hours as needed. (Patient not taking: Reported on 12/28/2024)     linaclotide  (LINZESS ) 145 MCG CAPS capsule Take 1 capsule (145 mcg total) by mouth daily before breakfast.     linaclotide  (LINZESS ) 145 MCG CAPS capsule Take 1 capsule (145 mcg total) by mouth daily before breakfast.     linaclotide  (LINZESS ) 145 MCG CAPS capsule Take 1 capsule (145 mcg total) by  mouth daily before breakfast. 90 capsule 3   losartan (COZAAR) 100 MG tablet Take 100 mg by mouth daily.     pantoprazole  (PROTONIX ) 40 MG tablet Take 40 mg by mouth daily.     pantoprazole  (PROTONIX ) 40 MG tablet Take 1 tablet (40 mg total) by mouth daily. 30 minutes before breakfast 90 tablet 3   polyethylene glycol (MIRALAX ) 17 g packet Take 17 g by mouth daily.     traZODone  (DESYREL ) 100 MG tablet Take 200 mg by mouth at bedtime.     No current facility-administered medications for this visit.     Past Surgical History:  Procedure Laterality Date   ABDOMINAL HYSTERECTOMY     ANTERIOR LAT LUMBAR FUSION Right 01/21/2017   Procedure: RIGHT LUMBAR THREE-FOUR LUMBAR FOUR - FIVE ANTEROLATERAL LUMBAR INTERBODY FUSION WITH PERCUTANEOUS PEDICLE SCREWS;  Surgeon: Fairy Levels, MD;  Location: [facility] OR;  Service: Neurosurgery;  Laterality: Right;  RIGHT L3-4 L4-5 ANTEROLATERAL LUMBAR INTERBODY FUSION WITH PERCUTANEOUS PEDICLE SCREWS   BACK SURGERY     BALLOON DILATION N/A 04/08/2014   Procedure: BALLOON DILATION;  Surgeon: Claudis RAYMOND Rivet, MD;  Location: [facility];  Service: Endoscopy;  Laterality: N/A;   BALLOON DILATION N/A 12/09/2022   Procedure: BALLOON DILATION;  Surgeon: Cindie Carlin POUR, DO;  Location: [facility];  Service: Endoscopy;  Laterality: N/A;   BIOPSY  12/09/2022   Procedure: BIOPSY;  Surgeon: Cindie Carlin POUR, DO;  Location: [facility];  Service: Endoscopy;;   BLADDER SUSPENSION  2013   [facility]   CHOLECYSTECTOMY     COLONOSCOPY WITH PROPOFOL  N/A 12/09/2022   Procedure: COLONOSCOPY WITH PROPOFOL ;  Surgeon: Cindie Carlin POUR, DO;  Location: [facility];  Service: Endoscopy;  Laterality: N/A;  9:30am, asa 2   ERCP N/A 04/08/2014   Procedure: ENDOSCOPIC RETROGRADE CHOLANGIOPANCREATOGRAPHY (ERCP);  Surgeon: Claudis RAYMOND Rivet, MD;  Location: [facility];  Service: Endoscopy;  Laterality: N/A;   ESOPHAGOGASTRODUODENOSCOPY (EGD) WITH PROPOFOL  N/A 12/09/2022   Procedure:  ESOPHAGOGASTRODUODENOSCOPY (EGD) WITH PROPOFOL ;  Surgeon: Cindie Carlin POUR, DO;  Location: [facility];  Service: Endoscopy;  Laterality: N/A;   LUMBAR PERCUTANEOUS PEDICLE SCREW 2 LEVEL Right 01/21/2017   Procedure: LUMBAR PERCUTANEOUS PEDICLE SCREW 2 LEVEL;  Surgeon: Fairy Levels, MD;  Location: [facility] OR;  Service: Neurosurgery;  Laterality: Right;   NASAL SEPTUM SURGERY  2013   [facility]   PANCREATIC STENT PLACEMENT N/A 04/08/2014   Procedure: PANCREATIC STENT PLACEMENT;  Surgeon: Claudis RAYMOND Rivet, MD;  Location: [facility];  Service: Endoscopy;  Laterality: N/A;   SPHINCTEROTOMY N/A 04/08/2014   Procedure: BILIARY SPHINCTEROTOMY;  Surgeon: Claudis RAYMOND Rivet, MD;  Location: [facility];  Service: Endoscopy;  Laterality: N/A;     Allergies[2]    Family History  Problem Relation Age of Onset   Colon cancer Neg Hx    Colon polyps Neg Hx      Social History Julie [name] reports that she has never smoked. She has never used smokeless tobacco. Julie [name] reports no history of alcohol use.    Physical Examination Today's Vitals   01/12/25 1105  BP: 110/70  Pulse: (!) 54  SpO2: 98%  Weight: 129 lb 6.4 oz (58.7 kg)  Height: 4' 11 (1.499 m)   Body mass index is 26.14 kg/m.  Gen: resting comfortably, no acute distress HEENT: no scleral icterus, pupils equal round and reactive, no palptable cervical adenopathy,  CV: RRR, no mrg, no jvd Resp: Clear to auscultation bilaterally GI: abdomen is soft, non-tender, non-distended, normal bowel sounds, no hepatosplenomegaly MSK: extremities are warm, no edema.  Skin: warm, no rash Neuro:  no focal deficits Psych: appropriate affect   Diagnostic Studies  Jan 2018 echo Study Conclusions   - Left ventricle: The cavity size was normal. Wall thickness was    increased in a pattern of mild LVH. Systolic function was normal.    The estimated ejection fraction was in the range of 55% to 60%.    Wall motion was normal; there were no regional wall  motion    abnormalities. Doppler parameters are consistent with abnormal    left ventricular relaxation (grade 1 diastolic dysfunction).   Impressions:   - Normal LV systolic function; grade 1 diastolic dysfunction; mild    LVH.    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     [1]  Allergies Allergen Reactions   Hydrochlorothiazide  Other Reaction(s): other  hydrochlorothiazide   Mirtazapine     Other Reaction(s): Not available  mirtazapine   No Known Allergies    Amlodipine     Other Reaction(s): other  amlodipine  [2]  Allergies Allergen Reactions   Hydrochlorothiazide     Other Reaction(s): other  hydrochlorothiazide   Mirtazapine     Other Reaction(s): Not available  mirtazapine   No Known Allergies    Amlodipine     Other Reaction(s): other  amlodipine   "

## 2025-01-13 ENCOUNTER — Telehealth: Payer: Self-pay

## 2025-01-13 NOTE — Telephone Encounter (Signed)
 Pt's Rx for Linzess  145mcg is still to high for her to purchase ($245.00) even after insurance. The insurance sent a list of meds that may / maynot need a PA. IBSrela, Lactulose solution, Lubiprostone, Motegrity, Movantik, Symporic, Polythylene Glycol, Trulance and Zelnorm. Pt is ok with switching medication. Please advise

## 2025-01-20 MED ORDER — LUBIPROSTONE 8 MCG PO CAPS: 8.0000 ug | ORAL_CAPSULE | Freq: Two times a day (BID) | ORAL | 3 refills | Status: AC

## 2025-01-20 NOTE — Telephone Encounter (Signed)
 Phoned and advised the pt of her Rx being sent to her pharmacy and her instructions.

## 2025-01-20 NOTE — Telephone Encounter (Signed)
 I sent in Amitiza  8 mcg po BID to take WITH FOOD to avoid nausea. If this is not helpful, we can increase it. Thanks!

## 2025-01-20 NOTE — Addendum Note (Signed)
 Addended by: SHIRLEAN THERISA ORN on: 01/20/2025 12:19 PM   Modules accepted: Orders

## 2025-01-25 NOTE — Telephone Encounter (Signed)
 PA approved from 12/30/2024--01/25/2026 for Lubiprostone  . Scanned to the pt's chart

## 2025-01-25 NOTE — Telephone Encounter (Signed)
 Pt's pharmacy faxed copy of PA approval to the pt's pharmacy @ [phone]

## 2025-01-25 NOTE — Telephone Encounter (Signed)
 PA done on Cover My Meds for Lubiprostone  caps. Dx used: K59.00. tried/ failed: Miralax , Linzess  145 mcg. Waiting on a response.

## 2025-02-24 ENCOUNTER — Ambulatory Visit: Admitting: Gastroenterology

## 2025-04-06 ENCOUNTER — Ambulatory Visit: Admitting: Cardiology
